# Patient Record
Sex: Female | Born: 1965 | Race: White | Hispanic: No | Marital: Married | Smoking: Never smoker
Health system: Southern US, Community
[De-identification: ages and names within clinical notes are randomized; demographics above are authoritative.]

## PROBLEM LIST (undated history)

## (undated) DIAGNOSIS — Z8669 Personal history of other diseases of the nervous system and sense organs: Secondary | ICD-10-CM

## (undated) DIAGNOSIS — E785 Hyperlipidemia, unspecified: Secondary | ICD-10-CM

## (undated) DIAGNOSIS — T7840XA Allergy, unspecified, initial encounter: Secondary | ICD-10-CM

## (undated) DIAGNOSIS — R55 Syncope and collapse: Secondary | ICD-10-CM

## (undated) HISTORY — DX: Syncope and collapse: R55

## (undated) HISTORY — DX: Allergy, unspecified, initial encounter: T78.40XA

## (undated) HISTORY — DX: Hyperlipidemia, unspecified: E78.5

## (undated) HISTORY — DX: Personal history of other diseases of the nervous system and sense organs: Z86.69

---

## 2000-10-16 ENCOUNTER — Emergency Department (HOSPITAL_COMMUNITY): Admission: EM | Admit: 2000-10-16 | Discharge: 2000-10-16 | Payer: Self-pay | Admitting: Emergency Medicine

## 2000-12-17 ENCOUNTER — Other Ambulatory Visit: Admission: RE | Admit: 2000-12-17 | Discharge: 2000-12-17 | Payer: Self-pay | Admitting: Gynecology

## 2001-12-31 ENCOUNTER — Other Ambulatory Visit: Admission: RE | Admit: 2001-12-31 | Discharge: 2001-12-31 | Payer: Self-pay | Admitting: Gynecology

## 2003-01-05 ENCOUNTER — Other Ambulatory Visit: Admission: RE | Admit: 2003-01-05 | Discharge: 2003-01-05 | Payer: Self-pay | Admitting: Gynecology

## 2003-03-26 ENCOUNTER — Encounter: Payer: Self-pay | Admitting: Gynecology

## 2003-03-26 ENCOUNTER — Encounter: Admission: RE | Admit: 2003-03-26 | Discharge: 2003-03-26 | Payer: Self-pay | Admitting: Gynecology

## 2004-01-12 ENCOUNTER — Other Ambulatory Visit: Admission: RE | Admit: 2004-01-12 | Discharge: 2004-01-12 | Payer: Self-pay | Admitting: Gynecology

## 2005-01-23 ENCOUNTER — Other Ambulatory Visit: Admission: RE | Admit: 2005-01-23 | Discharge: 2005-01-23 | Payer: Self-pay | Admitting: Gynecology

## 2005-08-08 ENCOUNTER — Other Ambulatory Visit: Admission: RE | Admit: 2005-08-08 | Discharge: 2005-08-08 | Payer: Self-pay | Admitting: Gynecology

## 2006-02-19 ENCOUNTER — Other Ambulatory Visit: Admission: RE | Admit: 2006-02-19 | Discharge: 2006-02-19 | Payer: Self-pay | Admitting: Gynecology

## 2006-05-28 ENCOUNTER — Encounter: Admission: RE | Admit: 2006-05-28 | Discharge: 2006-05-28 | Payer: Self-pay | Admitting: Family Medicine

## 2006-08-06 ENCOUNTER — Other Ambulatory Visit: Admission: RE | Admit: 2006-08-06 | Discharge: 2006-08-06 | Payer: Self-pay | Admitting: Gynecology

## 2006-12-13 ENCOUNTER — Ambulatory Visit (HOSPITAL_COMMUNITY): Admission: RE | Admit: 2006-12-13 | Discharge: 2006-12-13 | Payer: Self-pay | Admitting: Cardiovascular Disease

## 2006-12-18 ENCOUNTER — Ambulatory Visit (HOSPITAL_COMMUNITY): Admission: RE | Admit: 2006-12-18 | Discharge: 2006-12-18 | Payer: Self-pay | Admitting: *Deleted

## 2006-12-18 HISTORY — PX: TILT TABLE STUDY: SHX6124

## 2007-03-19 ENCOUNTER — Other Ambulatory Visit: Admission: RE | Admit: 2007-03-19 | Discharge: 2007-03-19 | Payer: Self-pay | Admitting: Gynecology

## 2008-04-15 ENCOUNTER — Encounter: Admission: RE | Admit: 2008-04-15 | Discharge: 2008-04-15 | Payer: Self-pay | Admitting: Gynecology

## 2009-09-09 ENCOUNTER — Ambulatory Visit (HOSPITAL_COMMUNITY): Admission: RE | Admit: 2009-09-09 | Discharge: 2009-09-09 | Payer: Self-pay | Admitting: Cardiovascular Disease

## 2010-04-27 HISTORY — PX: US ECHOCARDIOGRAPHY: HXRAD669

## 2010-05-17 ENCOUNTER — Encounter: Admission: RE | Admit: 2010-05-17 | Discharge: 2010-05-17 | Payer: Self-pay | Admitting: Gynecology

## 2010-10-23 HISTORY — PX: ABDOMINAL HYSTERECTOMY: SHX81

## 2011-03-05 ENCOUNTER — Emergency Department (HOSPITAL_COMMUNITY): Payer: BC Managed Care – PPO

## 2011-03-05 ENCOUNTER — Emergency Department (HOSPITAL_COMMUNITY)
Admission: EM | Admit: 2011-03-05 | Discharge: 2011-03-05 | Disposition: A | Discharge status: Home / Self Care | Payer: BC Managed Care – PPO | Attending: Emergency Medicine | Admitting: Emergency Medicine

## 2011-03-05 DIAGNOSIS — M545 Low back pain, unspecified: Secondary | ICD-10-CM | POA: Insufficient documentation

## 2011-03-05 DIAGNOSIS — W1789XA Other fall from one level to another, initial encounter: Secondary | ICD-10-CM | POA: Insufficient documentation

## 2011-03-05 DIAGNOSIS — Z79899 Other long term (current) drug therapy: Secondary | ICD-10-CM | POA: Insufficient documentation

## 2011-03-05 DIAGNOSIS — M25559 Pain in unspecified hip: Secondary | ICD-10-CM | POA: Insufficient documentation

## 2011-03-05 DIAGNOSIS — R51 Headache: Secondary | ICD-10-CM | POA: Insufficient documentation

## 2011-03-05 DIAGNOSIS — I10 Essential (primary) hypertension: Secondary | ICD-10-CM | POA: Insufficient documentation

## 2011-03-05 DIAGNOSIS — S335XXA Sprain of ligaments of lumbar spine, initial encounter: Secondary | ICD-10-CM | POA: Insufficient documentation

## 2011-03-05 DIAGNOSIS — M542 Cervicalgia: Secondary | ICD-10-CM | POA: Insufficient documentation

## 2011-03-05 DIAGNOSIS — N949 Unspecified condition associated with female genital organs and menstrual cycle: Secondary | ICD-10-CM | POA: Insufficient documentation

## 2011-03-10 NOTE — Cardiovascular Report (Signed)
NAMEARTEMIS, Elizabeth Romero                ACCOUNT NO.:  [id number]   MEDICAL RECORD NO.:  [id number]          PATIENT TYPE:  OIB   LOCATION:  2899                         FACILITY:  [facility]   PHYSICIAN:  Darlin Priestly, MD  DATE OF BIRTH:  04/09/1966   DATE OF PROCEDURE:  DATE OF DISCHARGE:                            CARDIAC CATHETERIZATION   PROCEDURES:  Heads-up tilt-table testing.   COMPLICATIONS:  None.   INDICATIONS:  [name] is a 44 year old female patient of Dr. Franchot Heidelberg and Dr. Vernon Prey with a history of intermittent tachy  palpitations and presyncope.  This has been going on for the last year  though she has noted that recently that she has had increasing in these  symptoms which occur at random including while sitting down.  She  initially feels like she gets a tachycardic and becomes very  lightheaded.  She is in the process of wearing an event monitor.  She is  now refereed for tilt table testing to rule out possible  neurocardiogenic etiology.   DESCRIPTION OF OPERATION:  After obtaining informed consent, the patient  cardiac cath lab in a fasting state and placed in a supine position.  Hemodynamic measures were obtained.  Resting heart rate is 80 with a  resting blood pressure os 110/76.  She was monitored for approximately 5  minutes and then tilt to 70 degree heads-up position.  Approximately 10  minutes into it she was noted to have progressive increasing heart rate.  Heart rates up to 112.  At approximately 15 minutes she was noted to  become markedly tachycardic with what appears to be a sinus tachycardia  rates  up to 162.  Blood pressure remained in the 100-110 range  systolically.  She then began to complain of shortness of breath and  lightheaded and then she had a marked drop in her heart rate from 140-  160 down to 45.  At that point her blood pressure dropped to 68/46 and  she became nonresponsive.  She was immediately returned to the supine  position where hemodynamic recovery was rather prompt with a return of  her blood pressure of 90/60 and return of her heart rate to 72.  She was  monitored for approximately 5-10 additional minutes and remained  hemodynamically stable and awake and alert without complaints.  She was  then transferred to the recovery room in stable condition.   CONCLUSIONS:  Positive heads-up tilt-table testing with documented sinus  tachycardia rates up to 162 and then abrupt drop to 45 with drop in  blood pressure to 60 and brief episode of unconsciousness.      Darlin Priestly, MD  Electronically Signed     RHM/MEDQ  D:  12/18/2006  T:  12/18/2006  Job:  [id number]   cc:   Gerlene Burdock A. Alanda Amass, M.D.  Ernestina Penna, M.D.

## 2012-04-10 HISTORY — PX: NM MYOCAR PERF WALL MOTION: HXRAD629

## 2013-01-10 ENCOUNTER — Ambulatory Visit: Payer: BC Managed Care – PPO

## 2013-01-10 ENCOUNTER — Encounter: Payer: Self-pay | Admitting: Family Medicine

## 2013-01-10 ENCOUNTER — Ambulatory Visit (INDEPENDENT_AMBULATORY_CARE_PROVIDER_SITE_OTHER): Payer: BC Managed Care – PPO | Admitting: Family Medicine

## 2013-01-10 VITALS — BP 103/71 | HR 78 | Temp 97.5°F | Wt 133.6 lb

## 2013-01-10 DIAGNOSIS — R05 Cough: Secondary | ICD-10-CM

## 2013-01-10 DIAGNOSIS — R059 Cough, unspecified: Secondary | ICD-10-CM

## 2013-01-10 DIAGNOSIS — J209 Acute bronchitis, unspecified: Secondary | ICD-10-CM

## 2013-01-10 DIAGNOSIS — J00 Acute nasopharyngitis [common cold]: Secondary | ICD-10-CM

## 2013-01-10 MED ORDER — HYDROCODONE-HOMATROPINE 5-1.5 MG/5ML PO SYRP: 5.0000 mL | ORAL_SOLUTION | Freq: Three times a day (TID) | ORAL | Status: DC | PRN

## 2013-01-10 MED ORDER — FLUTICASONE PROPIONATE 50 MCG/ACT NA SUSP: 2.0000 | Freq: Every day | NASAL | Status: DC

## 2013-01-10 NOTE — Progress Notes (Signed)
Subjective:     Patient ID: Elizabeth Romero, female   DOB: 05-24-1966, 47 y.o.   MRN: [id number]  HPI Patient was seen 4 days ago with acute bronchitis she then called back with severe coughing and had proair inhaler added. She is improving with the cough but still feels bad congested chest and productive of phlegm and some wheeziness for the cough. She's not having any night sweats and no fever. Never had asthma before.  No chest pain no palpitations no pedal edema her head is congested as well and the right ear feels full of fluid. Past Medical History  Diagnosis Date  . Allergy    Past Surgical History  Procedure Laterality Date  . Abdominal hysterectomy  2012    partial   History   Social History  . Marital Status: Married    Spouse Name: N/A    Number of Children: N/A  . Years of Education: N/A   Occupational History  . Not on file.   Social History Main Topics  . Smoking status: Never Smoker   . Smokeless tobacco: Not on file  . Alcohol Use: Not on file  . Drug Use: Not on file  . Sexually Active: Not on file   Other Topics Concern  . Not on file   Social History Narrative  . No narrative on file   Family History  Problem Relation Age of Onset  . Hypertension Father   . Hypertension Sister    No current outpatient prescriptions on file prior to visit.   No current facility-administered medications on file prior to visit.   No Known Allergies  There is no immunization history on file for this patient. Prior to Admission medications   Medication Sig Start Date End Date Taking? Authorizing Provider  cefdinir (OMNICEF) 300 MG capsule  01/03/13  Yes Historical Provider, MD  CHERATUSSIN AC 100-10 MG/5ML syrup  01/03/13  Yes Historical Provider, MD  metoprolol succinate (TOPROL-XL) 25 MG 24 hr tablet  12/08/12  Yes Historical Provider, MD  pantoprazole (PROTONIX) 40 MG tablet  10/28/12  Yes Historical Provider, MD  fluticasone (FLONASE) 50 MCG/ACT nasal spray Place 2  sprays into the nose daily. 01/10/13   Ileana Ladd, MD  HYDROcodone-homatropine [facility]) 5-1.5 MG/5ML syrup Take 5 mLs by mouth every 8 (eight) hours as needed for cough. 01/10/13   Ileana Ladd, MD    Review of Systems  Constitutional: Positive for fatigue.  HENT: Positive for congestion.   Eyes: Negative.   Respiratory: Positive for cough and wheezing.   Cardiovascular: Negative.   Gastrointestinal: Negative.   Endocrine: Negative.   Genitourinary: Negative.   Musculoskeletal: Negative.   Allergic/Immunologic: Negative.   Neurological: Positive for weakness.  Hematological: Negative.   Psychiatric/Behavioral: Negative.        Objective:   Physical Exam    On examination she appeared in good spirits. However she is congested with a barking cough and a slight wheeze at the end of the cough Well developed, well nourished. Vital signs as documented. BP 103/71  Pulse 78  Temp(Src) 97.5 F (36.4 C) (Oral)  Wt 133 lb 9.6 oz (60.601 kg)  LMP 12/28/2012  Skin warm and dry and without overt rashes. Head & Neck without JVD. Lungs scattered rhonchi a few end expiratory wheezes. No rales..  Heart exam notable for regular rhythm, normal sounds and absence of murmurs, rubs or gallops. Abdomen unremarkable and without evidence of organomegaly, masses, or abdominal aortic enlargement.  Breast exam:  not performed. Gyn Exam: Not performed. External Genitalia: Vagina:: Cervix: Uterus: Adnexae: R/V: Extremities nonedematous. Assessment:     Cough - Plan: DG Chest 2 View  Acute bronchitis  Acute rhinitis - Plan: fluticasone (FLONASE) 50 MCG/ACT nasal spray      Plan:     She did not get a prescription for proair HFA as yet. We have samples we have determined demonstrated use of inhaler. She should continue with the [facility]. She should continue with the protonix.   I added Flonase today and moved up to a stronger cough medicine. Orders Placed This Encounter  Procedures   . DG Chest 2 View    Standing Status: Future     Number of Occurrences: 1     Standing Expiration Date: 03/12/2014    Order Specific Question:  Reason for Exam (SYMPTOM  OR DIAGNOSIS REQUIRED)    Answer:  cough    Order Specific Question:  Is the patient pregnant?    Answer:  No    Order Specific Question:  Preferred imaging location?    Answer:  Internal   [facility] reading (PRIMARY) by  Dr. Leodis Sias: Preliminary chest x-ray shows peribronchial edema, reflective of a bronchitic event. Await official reading of the x-ray.                                  Kaedon Fanelli P. Modesto Charon, M.D.

## 2013-07-28 ENCOUNTER — Telehealth: Payer: Self-pay | Admitting: Cardiovascular Disease

## 2013-07-28 NOTE — Telephone Encounter (Signed)
Need refill on Metoprolol 25 mg #90

## 2013-08-05 ENCOUNTER — Other Ambulatory Visit: Payer: Self-pay | Admitting: *Deleted

## 2013-08-05 MED ORDER — METOPROLOL SUCCINATE ER 25 MG PO TB24: 25.0000 mg | ORAL_TABLET | Freq: Every day | ORAL | Status: DC

## 2013-08-05 NOTE — Telephone Encounter (Signed)
Metoprolol refilled.

## 2013-08-05 NOTE — Telephone Encounter (Signed)
Message forwarded to [facility]. Berlinda Last, LPN. Paper chart requested. [phone])

## 2013-08-11 ENCOUNTER — Other Ambulatory Visit: Payer: Self-pay | Admitting: *Deleted

## 2013-08-11 MED ORDER — METOPROLOL SUCCINATE ER 25 MG PO TB24: 25.0000 mg | ORAL_TABLET | Freq: Every day | ORAL | Status: DC

## 2013-08-13 ENCOUNTER — Encounter: Payer: Self-pay | Admitting: Cardiovascular Disease

## 2013-08-13 ENCOUNTER — Ambulatory Visit (INDEPENDENT_AMBULATORY_CARE_PROVIDER_SITE_OTHER): Payer: BC Managed Care – PPO | Admitting: Cardiovascular Disease

## 2013-08-13 VITALS — BP 110/60 | HR 79 | Resp 16 | Ht 69.0 in | Wt 178.9 lb

## 2013-08-13 DIAGNOSIS — R5383 Other fatigue: Secondary | ICD-10-CM

## 2013-08-13 DIAGNOSIS — R5381 Other malaise: Secondary | ICD-10-CM

## 2013-08-13 DIAGNOSIS — E785 Hyperlipidemia, unspecified: Secondary | ICD-10-CM

## 2013-08-13 DIAGNOSIS — E782 Mixed hyperlipidemia: Secondary | ICD-10-CM

## 2013-08-13 DIAGNOSIS — R55 Syncope and collapse: Secondary | ICD-10-CM

## 2013-08-13 DIAGNOSIS — Z79899 Other long term (current) drug therapy: Secondary | ICD-10-CM

## 2013-08-13 NOTE — Patient Instructions (Signed)
Your physician recommends that you schedule a follow-up appointment in: one year  Your physician recommends that you have FASTING lab work.

## 2013-08-24 ENCOUNTER — Encounter: Payer: Self-pay | Admitting: Cardiovascular Disease

## 2013-08-24 DIAGNOSIS — R55 Syncope and collapse: Secondary | ICD-10-CM | POA: Insufficient documentation

## 2013-08-24 DIAGNOSIS — E785 Hyperlipidemia, unspecified: Secondary | ICD-10-CM | POA: Insufficient documentation

## 2013-08-24 NOTE — Assessment & Plan Note (Signed)
Her most recent lipid profile was performed in January of this year showed a total cholesterol 127, triglycerides 79, HDL 48, LDL 63 all of which are excellent. Her thyroid function tests, liver function tests, hemoglobin, electrolytes were all normal. Creatinine was 0.64

## 2013-08-24 NOTE — Assessment & Plan Note (Signed)
It has been a long time since she has had a syncopal event. She is managing this well by avoiding known triggers. She has benefited from low-dose beta blockers. No changes are made for medications. She is reminded to stay well hydrated and to eat a relatively salt rich diet. Her blood pressure is excellent. No changes are made to her medications.

## 2013-08-24 NOTE — Progress Notes (Signed)
Patient ID: Elizabeth Romero, female   DOB: 01-03-1966, 47 y.o.   MRN: [id number]      Reason for office visit Establish new cardiology followup  I previously met Elizabeth Romero when I performed a heart catheterization on her father. 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  She feels well and has no cardiac complaints. In 2008 she had a positive tilt table test combined cardioinhibitory and vasodilator components. She did not tolerate SSRI therapy well but has been doing well on low dose of metoprolol. An echocardiogram from 2011 was normal as was a nuclear stress test in June of 2013 (the latter showed some changes consistent with attenuation artifact). a 30 day event monitor in 2008 was normal.   No Known Allergies  Current Outpatient Prescriptions  Medication Sig Dispense Refill  . fluticasone (FLONASE) 50 MCG/ACT nasal spray Place 2 sprays into the nose daily as needed.      . metoprolol succinate (TOPROL-XL) 25 MG 24 hr tablet Take 1 tablet (25 mg total) by mouth daily. Keep appointment for refills.  30 tablet  0  . pantoprazole (PROTONIX) 40 MG tablet Take 40 mg by mouth daily as needed.       . simvastatin (ZOCOR) 40 MG tablet Take 40 mg by mouth every evening.       No current facility-administered medications for this visit.    Past Medical History  Diagnosis Date  . Allergy     Past Surgical History  Procedure Laterality Date  . Abdominal hysterectomy  2012    partial    Family History  Problem Relation Age of Onset  . Hypertension Father   . Hypertension Sister     History   Social History  . Marital Status: Married    Spouse Name: N/A    Number of Children: N/A  . Years of Education: N/A   Occupational History  . Not on file.   Social History Main Topics  . Smoking status: Never Smoker   . Smokeless tobacco: Not on file  . Alcohol Use: Not on file  . Drug Use: Not on file  . Sexual Activity: Not on file    Other Topics Concern  . Not on file   Social History Narrative  . No narrative on file    Review of systems: The patient specifically denies any chest pain at rest or with exertion, dyspnea at rest or with exertion, orthopnea, paroxysmal nocturnal dyspnea, syncope, palpitations, focal neurological deficits, intermittent claudication, lower extremity edema, unexplained weight gain, cough, hemoptysis or wheezing.  The patient also denies abdominal pain, nausea, vomiting, dysphagia, diarrhea, constipation, polyuria, polydipsia, dysuria, hematuria, frequency, urgency, abnormal bleeding or bruising, fever, chills, unexpected weight changes, mood swings, change in skin or hair texture, change in voice quality, auditory or visual problems, allergic reactions or rashes, new musculoskeletal complaints other than usual "aches and pains".   PHYSICAL EXAM BP 110/60  Pulse 79  Resp 16  Ht 5\' 9"  (1.753 m)  Wt 178 lb 14.4 oz (81.149 kg)  BMI 26.41 kg/m2  General: Alert, oriented x3, no distress Head: no evidence of trauma, PERRL, EOMI, no exophtalmos or lid lag, no myxedema, no xanthelasma; normal ears, nose and oropharynx Neck: normal jugular venous pulsations and no hepatojugular reflux; brisk carotid pulses without delay and no carotid bruits Chest: clear to auscultation, no signs of consolidation by percussion or palpation, normal fremitus, symmetrical and full respiratory excursions Cardiovascular: normal position and  quality of the apical impulse, regular rhythm, normal first and second heart sounds, no murmurs, rubs or gallops Abdomen: no tenderness or distention, no masses by palpation, no abnormal pulsatility or arterial bruits, normal bowel sounds, no hepatosplenomegaly Extremities: no clubbing, cyanosis or edema; 2+ radial, ulnar and brachial pulses bilaterally; 2+ right femoral, posterior tibial and dorsalis pedis pulses; 2+ left femoral, posterior tibial and dorsalis pedis pulses; no  subclavian or femoral bruits Neurological: grossly nonfocal   EKG: Sinus rhythm, very minor nonspecific ST segment abnormality  Lipid Panel  January 2014 total cholesterol 127, triglycerides 79, HDL 48, LDL 63  thyroid function tests, liver function tests, hemoglobin, electrolytes were all normal. Creatinine was 0.64  ASSESSMENT AND PLAN Neurocardiogenic syncope It has been a long time since she has had a syncopal event. She is managing this well by avoiding known triggers. She has benefited from low-dose beta blockers. No changes are made for medications. She is reminded to stay well hydrated and to eat a relatively salt rich diet. Her blood pressure is excellent. No changes are made to her medications.  Hyperlipidemia Her most recent lipid profile was performed in January of this year showed a total cholesterol 127, triglycerides 79, HDL 48, LDL 63 all of which are excellent. Her thyroid function tests, liver function tests, hemoglobin, electrolytes were all normal. Creatinine was 0.64   Orders Placed This Encounter  Procedures  . CBC  . Lipid panel  . Comprehensive metabolic panel  . EKG 12-Lead   Meds ordered this encounter  Medications  . simvastatin (ZOCOR) 40 MG tablet    Sig: Take 40 mg by mouth every evening.  . fluticasone (FLONASE) 50 MCG/ACT nasal spray    Sig: Place 2 sprays into the nose daily as needed.    Junious Silk, MD, [facility] [phone] office [phone] pager

## 2013-08-28 ENCOUNTER — Other Ambulatory Visit: Payer: Self-pay

## 2013-09-11 ENCOUNTER — Other Ambulatory Visit: Payer: Self-pay | Admitting: Cardiovascular Disease

## 2013-09-11 LAB — CBC
HCT: 41.7 % (ref 36.0–46.0)
Hemoglobin: 14.1 g/dL (ref 12.0–15.0)
MCH: 31.1 pg (ref 26.0–34.0)
MCHC: 33.8 g/dL (ref 30.0–36.0)
MCV: 91.9 fL (ref 78.0–100.0)
Platelets: 151 10*3/uL (ref 150–400)
RBC: 4.54 MIL/uL (ref 3.87–5.11)
RDW: 13.4 % (ref 11.5–15.5)
WBC: 6.9 10*3/uL (ref 4.0–10.5)

## 2013-09-11 LAB — COMPREHENSIVE METABOLIC PANEL
ALT: 10 U/L (ref 0–35)
AST: 13 U/L (ref 0–37)
Albumin: 4.2 g/dL (ref 3.5–5.2)
Alkaline Phosphatase: 55 U/L (ref 39–117)
BUN: 10 mg/dL (ref 6–23)
CO2: 28 mEq/L (ref 19–32)
Calcium: 8.9 mg/dL (ref 8.4–10.5)
Chloride: 107 mEq/L (ref 96–112)
Creat: 0.6 mg/dL (ref 0.50–1.10)
Glucose, Bld: 77 mg/dL (ref 70–99)
Potassium: 4 mEq/L (ref 3.5–5.3)
Sodium: 140 mEq/L (ref 135–145)
Total Bilirubin: 0.6 mg/dL (ref 0.3–1.2)
Total Protein: 6.8 g/dL (ref 6.0–8.3)

## 2013-09-11 LAB — LIPID PANEL
Cholesterol: 195 mg/dL (ref 0–200)
HDL: 46 mg/dL (ref >39)
LDL Cholesterol: 133 mg/dL — ABNORMAL HIGH (ref 0–99)
Total CHOL/HDL Ratio: 4.2 Ratio
Triglycerides: 82 mg/dL (ref <150)
VLDL: 16 mg/dL (ref 0–40)

## 2013-09-11 NOTE — Telephone Encounter (Signed)
Rx was sent to pharmacy electronically. 

## 2013-10-03 ENCOUNTER — Other Ambulatory Visit: Payer: Self-pay | Admitting: *Deleted

## 2013-10-03 MED ORDER — METOPROLOL SUCCINATE ER 25 MG PO TB24: 25.0000 mg | ORAL_TABLET | Freq: Every day | ORAL | Status: DC

## 2013-10-03 NOTE — Telephone Encounter (Signed)
E-sent refill #90 x 3

## 2014-06-15 ENCOUNTER — Ambulatory Visit (INDEPENDENT_AMBULATORY_CARE_PROVIDER_SITE_OTHER): Payer: BC Managed Care – PPO | Admitting: Family Medicine

## 2014-06-15 ENCOUNTER — Encounter: Payer: Self-pay | Admitting: Family Medicine

## 2014-06-15 ENCOUNTER — Ambulatory Visit (INDEPENDENT_AMBULATORY_CARE_PROVIDER_SITE_OTHER): Payer: BC Managed Care – PPO

## 2014-06-15 VITALS — BP 104/65 | HR 77 | Temp 98.1°F | Ht 69.0 in | Wt 177.4 lb

## 2014-06-15 DIAGNOSIS — M79601 Pain in right arm: Secondary | ICD-10-CM

## 2014-06-15 DIAGNOSIS — M79609 Pain in unspecified limb: Secondary | ICD-10-CM

## 2014-06-15 NOTE — Progress Notes (Signed)
   Subjective:    Patient ID: Elizabeth Romero, female    DOB: 1966/03/09, 48 y.o.   MRN: [id number]  HPI Patient was walking and tripped and fell and broke fall with right hand then rolled onto the right arm and felt something pop and then has had persistent pain and discomfort with abduction.   Review of Systems C/o arthralgias and myalgias   No chest pain, SOB, HA, dizziness, vision change, N/V, diarrhea, constipation, dysuria, urinary urgency or frequency or rash.  Objective:   Physical Exam  Vital signs noted  Well developed well nourished female.  HEENT - Head atraumatic Normocephalic Respiratory - Lungs CTA bilateral Cardiac - RRR S1 and S2 without murmur MS - TTP right deltoid insertion site and decreased ROM with abduction     Xray right humerus - no fracture Deatra Canter FNP Assessment & Plan:  Right arm pain - Plan: DG Humerus Right.  Tylenol and motrin otc prn  Deatra Canter FNP

## 2014-10-02 IMAGING — CR DG HUMERUS 2V *R*
2 series · 2 of 2 positions shown · non-contrast
Comparison: None.

CLINICAL DATA: Pain post fall.

EXAM:
RIGHT HUMERUS - 2+ VIEW

[view not recorded (1 of 2)]
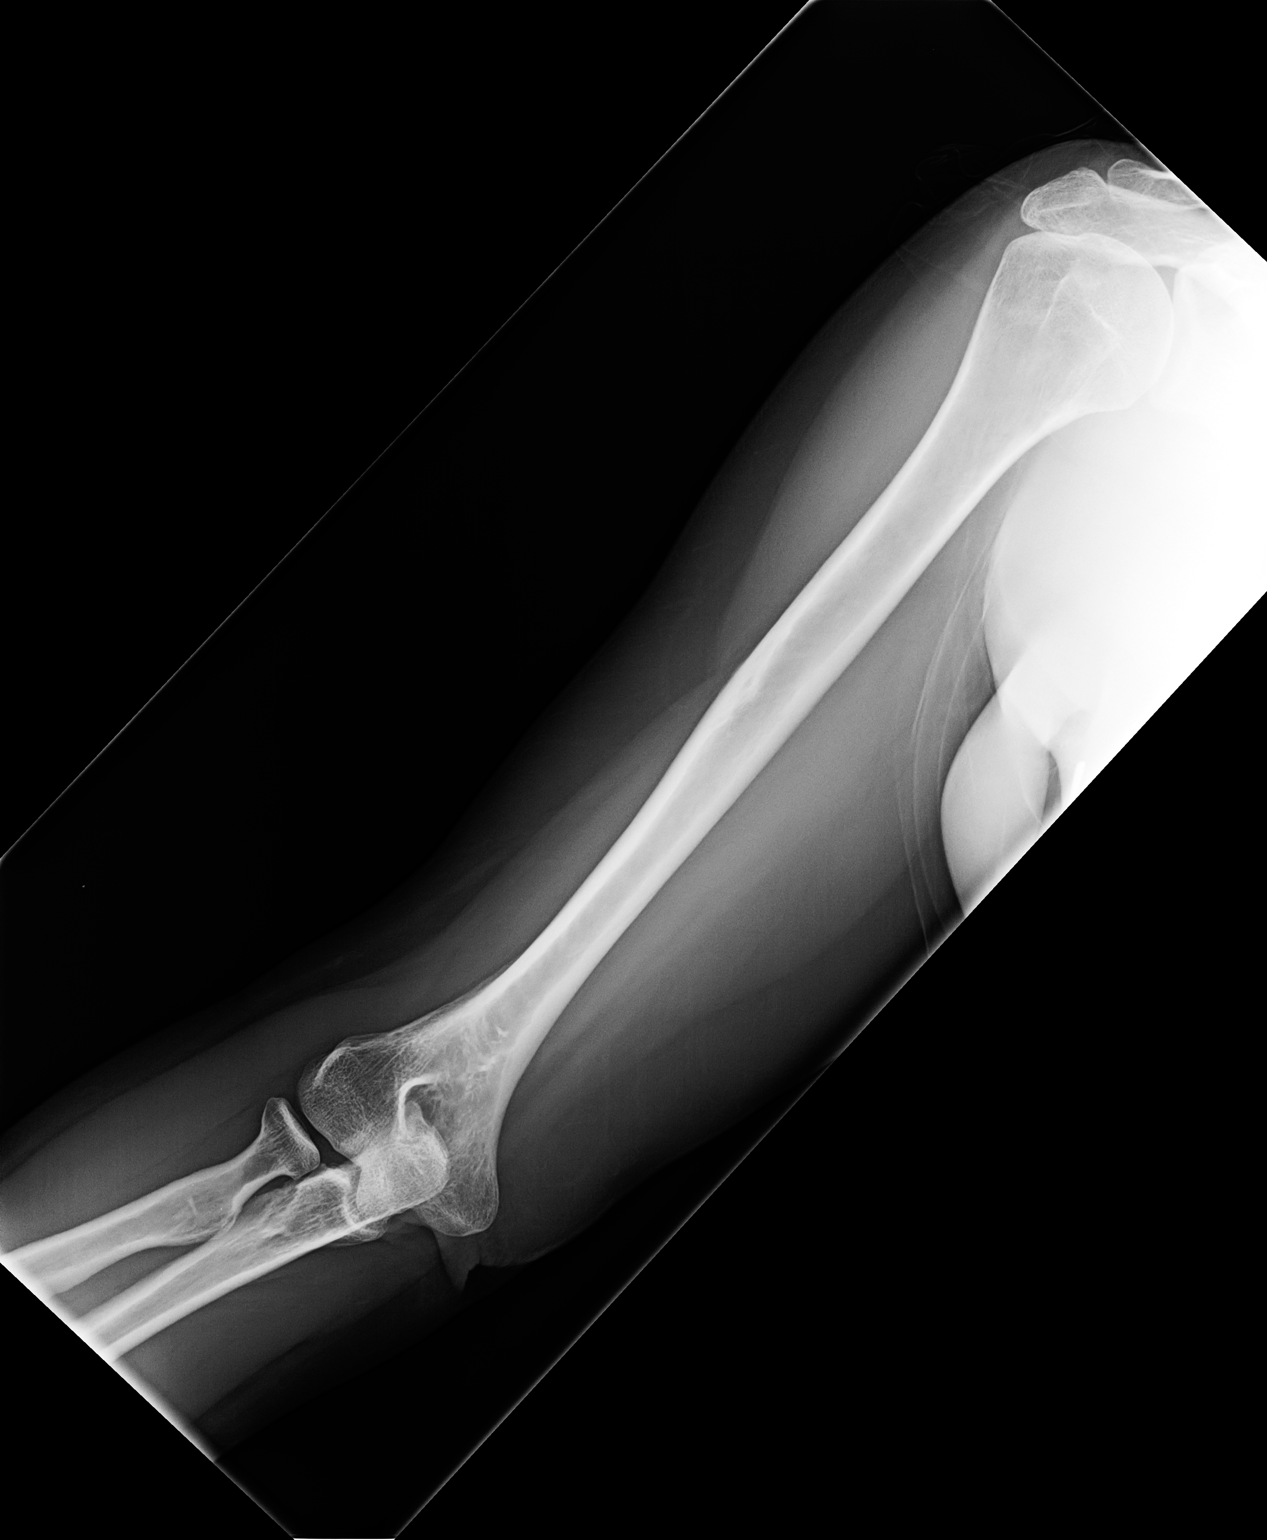

[view not recorded (2 of 2)]
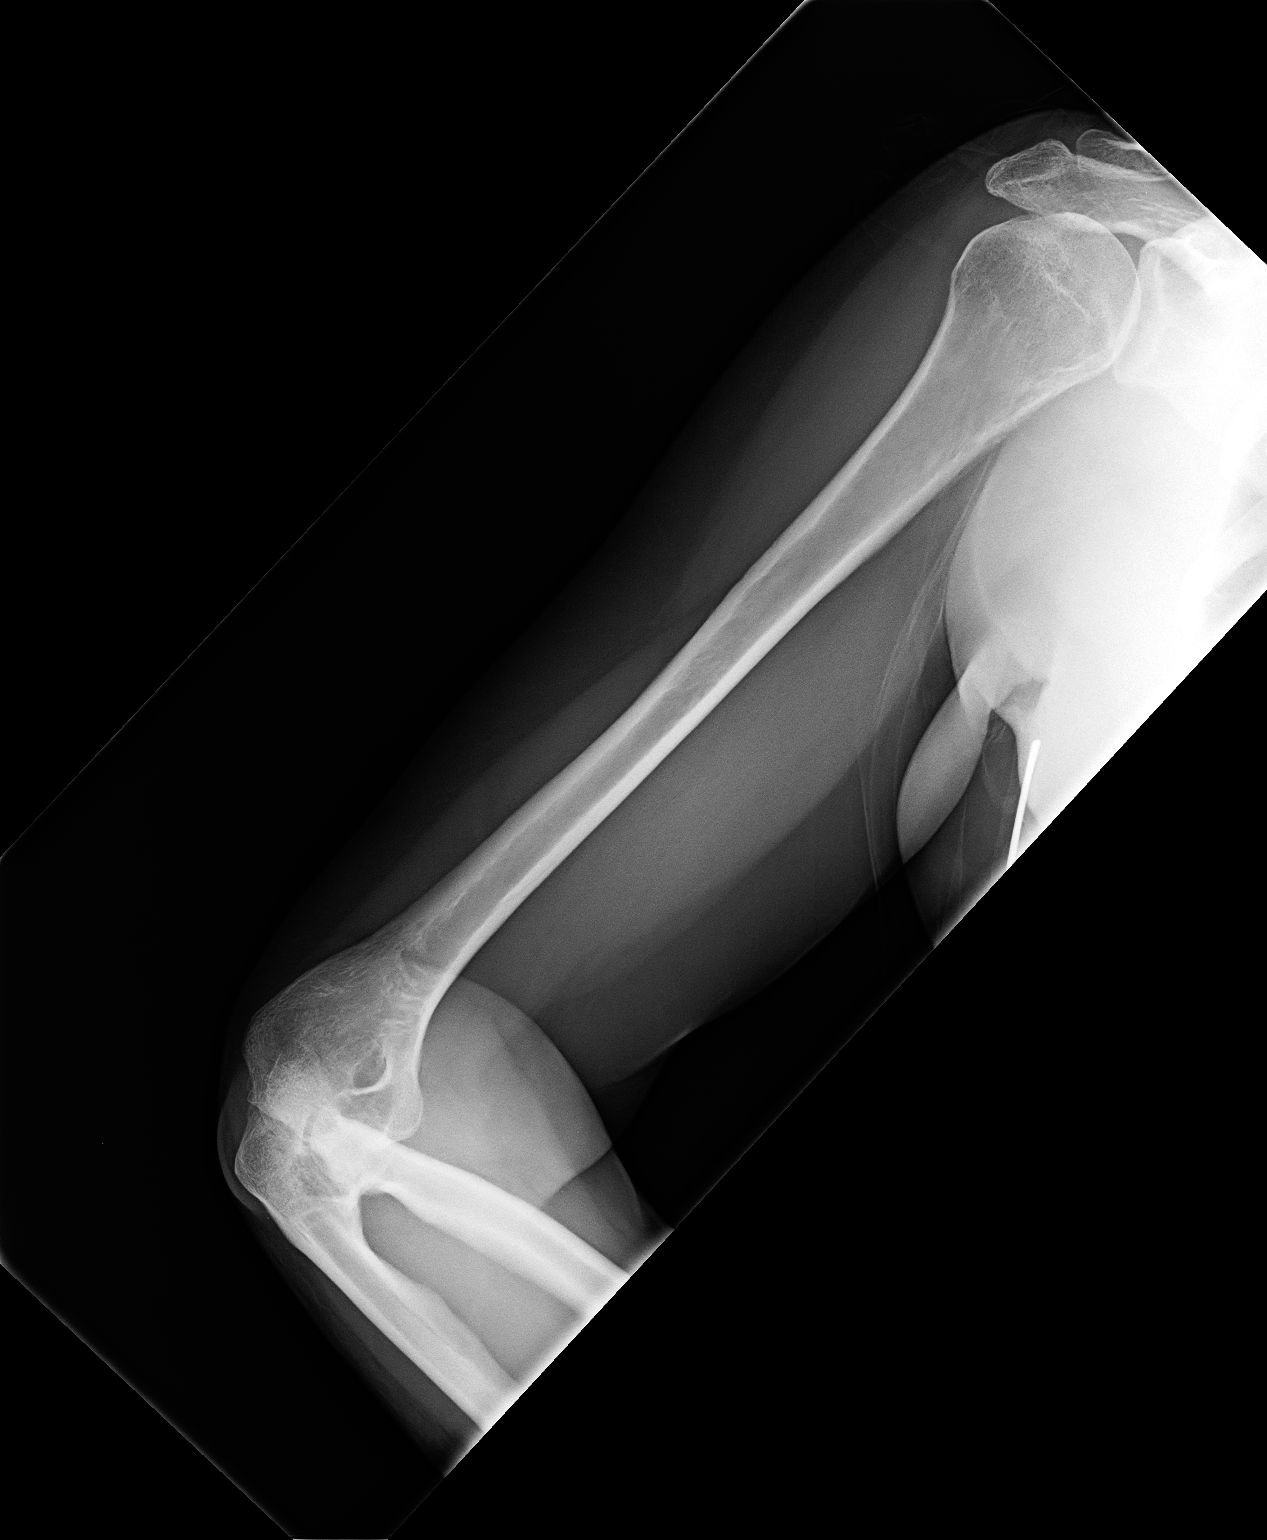

[2 of 2 positions shown; findings below may reference images not displayed]

FINDINGS: There is no evidence of fracture or other focal bone lesions. Soft
tissues are unremarkable.
IMPRESSION: Negative.

## 2014-10-21 ENCOUNTER — Encounter: Payer: Self-pay | Admitting: *Deleted

## 2014-10-26 ENCOUNTER — Encounter: Payer: Self-pay | Admitting: Cardiovascular Disease

## 2014-10-26 ENCOUNTER — Ambulatory Visit (INDEPENDENT_AMBULATORY_CARE_PROVIDER_SITE_OTHER): Payer: BLUE CROSS/BLUE SHIELD | Admitting: Cardiovascular Disease

## 2014-10-26 VITALS — BP 104/70 | HR 67 | Resp 16 | Ht 69.0 in | Wt 175.0 lb

## 2014-10-26 DIAGNOSIS — E785 Hyperlipidemia, unspecified: Secondary | ICD-10-CM

## 2014-10-26 DIAGNOSIS — R55 Syncope and collapse: Secondary | ICD-10-CM

## 2014-10-26 MED ORDER — PANTOPRAZOLE SODIUM 40 MG PO TBEC: 40.0000 mg | DELAYED_RELEASE_TABLET | Freq: Every day | ORAL | Status: DC | PRN

## 2014-10-26 MED ORDER — METOPROLOL SUCCINATE ER 25 MG PO TB24: 25.0000 mg | ORAL_TABLET | Freq: Every day | ORAL | Status: DC

## 2014-10-26 MED ORDER — SIMVASTATIN 40 MG PO TABS: 40.0000 mg | ORAL_TABLET | Freq: Every evening | ORAL | Status: DC

## 2014-10-26 NOTE — Patient Instructions (Signed)
Dr. Croitoru recommends that you schedule a follow-up appointment in: One year.   

## 2014-10-27 ENCOUNTER — Encounter: Payer: Self-pay | Admitting: Cardiovascular Disease

## 2014-10-27 NOTE — Progress Notes (Signed)
Patient ID: Elizabeth Romero, female   DOB: 1966-05-09, 49 y.o.   MRN: [id number]     Reason for office visit Neurocardiogenic syncope  Elizabeth Romero is here for routine f/u for a lifelong history of very infrequent neurally mediated syncope. No typical events in the last 12 months. She had 3 days of nonstop "indigestion" earlier this year, neither provoked nor worsened by physical activity. She has reare palpitations. Her ECG is normal.  In 2008 she had a positive tilt table test combined cardioinhibitory and vasodilator components. She did not tolerate SSRI therapy well but has been doing well on low dose of metoprolol. An echocardiogram from 2011 was normal as was a nuclear stress test in June of 2013 (the latter showed some changes consistent with attenuation artifact). a 30 day event monitor in 2008 was normal.     No Known Allergies  Current Outpatient Prescriptions  Medication Sig Dispense Refill  . fluticasone (FLONASE) 50 MCG/ACT nasal spray Place 2 sprays into the nose daily as needed.    . metoprolol succinate (TOPROL-XL) 25 MG 24 hr tablet Take 1 tablet (25 mg total) by mouth daily. 90 tablet 3  . pantoprazole (PROTONIX) 40 MG tablet Take 1 tablet (40 mg total) by mouth daily as needed. 90 tablet 3  . simvastatin (ZOCOR) 40 MG tablet Take 1 tablet (40 mg total) by mouth every evening. 90 tablet 3   No current facility-administered medications for this visit.    Past Medical History  Diagnosis Date  . Allergy   . Neurocardiogenic syncope     H/O  . History of migraine headaches   . Hyperlipidemia     Past Surgical History  Procedure Laterality Date  . Abdominal hysterectomy  2012  . Tilt table study  12/18/2006    Positive - documented sinus tach rates up to 162 and dropped abruptly to 45 w/BP drop to 60 w/brief episode of unconsciousness  . US echocardiography  04/27/2010    Normal EF >55%  . Nm myocar perf wall motion  04/10/2012    Low risk scan    Family History  Problem  Relation Age of Onset  . Hypertension Father   . Hypertension Sister     History   Social History  . Marital Status: Married    Spouse Name: N/A    Number of Children: N/A  . Years of Education: N/A   Occupational History  . Not on file.   Social History Main Topics  . Smoking status: Never Smoker   . Smokeless tobacco: Not on file  . Alcohol Use: No  . Drug Use: No  . Sexual Activity: Not on file   Other Topics Concern  . Not on file   Social History Narrative    Review of systems: The patient specifically denies any chest pain at rest or with exertion, dyspnea at rest or with exertion, orthopnea, paroxysmal nocturnal dyspnea, syncope, focal neurological deficits, intermittent claudication, lower extremity edema, unexplained weight gain, cough, hemoptysis or wheezing.  The patient also denies abdominal pain, nausea, vomiting, dysphagia, diarrhea, constipation, polyuria, polydipsia, dysuria, hematuria, frequency, urgency, abnormal bleeding or bruising, fever, chills, unexpected weight changes, mood swings, change in skin or hair texture, change in voice quality, auditory or visual problems, allergic reactions or rashes, new musculoskeletal complaints other than usual "aches and pains". \  PHYSICAL EXAM BP 104/70 mmHg  Pulse 67  Resp 16  Ht  (1.753 m)  Wt 175 lb (79.379 kg)  BMI 25.83  kg/m2  General: Alert, oriented x3, no distress Head: no evidence of trauma, PERRL, EOMI, no exophtalmos or lid lag, no myxedema, no xanthelasma; normal ears, nose and oropharynx Neck: normal jugular venous pulsations and no hepatojugular reflux; brisk carotid pulses without delay and no carotid bruits Chest: clear to auscultation, no signs of consolidation by percussion or palpation, normal fremitus, symmetrical and full respiratory excursions Cardiovascular: normal position and quality of the apical impulse, regular rhythm, normal first and second heart sounds, no murmurs, rubs or  gallops Abdomen: no tenderness or distention, no masses by palpation, no abnormal pulsatility or arterial bruits, normal bowel sounds, no hepatosplenomegaly Extremities: no clubbing, cyanosis or edema; 2+ radial, ulnar and brachial pulses bilaterally; 2+ right femoral, posterior tibial and dorsalis pedis pulses; 2+ left femoral, posterior tibial and dorsalis pedis pulses; no subclavian or femoral bruits Neurological: grossly nonfocal   EKG: NSR/sinus arrhythmia  Lipid Panel     Component Value Date/Time   CHOL 195 09/11/2013 0852   TRIG 82 09/11/2013 0852   HDL 46 09/11/2013 0852   CHOLHDL 4.2 09/11/2013 0852   VLDL 16 09/11/2013 0852   LDLCALC 133* 09/11/2013 0852    BMET    Component Value Date/Time   NA 140 09/11/2013 0852   K 4.0 09/11/2013 0852   CL 107 09/11/2013 0852   CO2 28 09/11/2013 0852   GLUCOSE 77 09/11/2013 0852   BUN 10 09/11/2013 0852   CREATININE 0.60 09/11/2013 0852   CALCIUM 8.9 09/11/2013 0852     ASSESSMENT AND PLAN Neurocardiogenic syncope It has been a long time since she has had a syncopal event. She is managing this well by avoiding known triggers. She has benefited from low-dose beta blockers. No changes are made for medications. She is reminded to stay well hydrated and to eat a relatively salt rich diet. Her blood pressure is excellent. No changes are made to her medications. The 3 day long episode of chest discomfort is unlikely to have had coronary etiology. There is no evidence of a previous event on her ECG. Recommend yearly follow up. Orders Placed This Encounter  Procedures  . EKG 12-Lead   Meds ordered this encounter  Medications  . metoprolol succinate (TOPROL-XL) 25 MG 24 hr tablet    Sig: Take 1 tablet (25 mg total) by mouth daily.    Dispense:  90 tablet    Refill:  3  . pantoprazole (PROTONIX) 40 MG tablet    Sig: Take 1 tablet (40 mg total) by mouth daily as needed.    Dispense:  90 tablet    Refill:  3  . simvastatin (ZOCOR)  40 MG tablet    Sig: Take 1 tablet (40 mg total) by mouth every evening.    Dispense:  90 tablet    Refill:  3    Sebastyan Snodgrass  Thurmon FairMihai Dave Mergen, MD, [facility] [phone] office [phone] pager

## 2014-11-11 ENCOUNTER — Other Ambulatory Visit: Payer: Self-pay | Admitting: Cardiovascular Disease

## 2014-11-11 NOTE — Telephone Encounter (Signed)
Metoprolol succinate refill #90 with 3 refills 10/26/14

## 2015-04-13 ENCOUNTER — Encounter: Payer: Self-pay | Admitting: Family Medicine

## 2015-04-13 ENCOUNTER — Ambulatory Visit (INDEPENDENT_AMBULATORY_CARE_PROVIDER_SITE_OTHER): Payer: BLUE CROSS/BLUE SHIELD | Admitting: Family Medicine

## 2015-04-13 VITALS — BP 93/59 | HR 78 | Temp 97.4°F | Ht 69.0 in | Wt 169.0 lb

## 2015-04-13 DIAGNOSIS — E785 Hyperlipidemia, unspecified: Secondary | ICD-10-CM

## 2015-04-13 DIAGNOSIS — R55 Syncope and collapse: Secondary | ICD-10-CM

## 2015-04-13 DIAGNOSIS — G43909 Migraine, unspecified, not intractable, without status migrainosus: Secondary | ICD-10-CM | POA: Insufficient documentation

## 2015-04-13 DIAGNOSIS — G43B0 Ophthalmoplegic migraine, not intractable: Secondary | ICD-10-CM | POA: Diagnosis not present

## 2015-04-13 NOTE — Progress Notes (Signed)
   Subjective:    Patient ID: Elizabeth Romero, female    DOB: [birth year], 49 y.o.   MRN: [id number]  HPI 49 year old female with history of ophthalmologic migraine. This was diagnosed by her cardiologist who was seeing her for syncopal episode. He also prescribed Imitrex which made her "chest feel funny.". Diagnosis occurred about 5 years ago. Symptoms completely resolved but have returned in the last month and she is having 1-2 episodes per week that last 30-40 minutes. There is no associated headache or other symptoms chest wavy lines. There are no visual field Cuts.   Review of Systems  Constitutional: Negative.   HENT: Negative.   Eyes: Negative.   Respiratory: Negative.   Cardiovascular: Negative.   Gastrointestinal: Negative.   Endocrine: Negative.   Genitourinary: Negative.   Neurological: Negative.        Visual symptoms as described in history of present illnes  Hematological: Negative.   Psychiatric/Behavioral: Negative.    Patient Active Problem List   Diagnosis Date Noted  . Neurocardiogenic syncope 08/24/2013  . Hyperlipidemia 08/24/2013   Outpatient Encounter Prescriptions as of 04/13/2015  Medication Sig  . fluticasone (FLONASE) 50 MCG/ACT nasal spray Place 2 sprays into the nose daily as needed.  . metoprolol succinate (TOPROL-XL) 25 MG 24 hr tablet Take 1 tablet (25 mg total) by mouth daily.  . pantoprazole (PROTONIX) 40 MG tablet Take 1 tablet (40 mg total) by mouth daily as needed.  . simvastatin (ZOCOR) 40 MG tablet Take 1 tablet (40 mg total) by mouth every evening.   No facility-administered encounter medications on file as of 04/13/2015.       Objective:   Physical Exam  Constitutional: She is oriented to person, place, and time. She appears well-developed and well-nourished.  Neurological: She is alert and oriented to person, place, and time. She has normal reflexes. No cranial nerve deficit. Coordination normal.          Assessment & Plan:  1.  Neurocardiogenic syncope Symptoms are controlled, apparently on metoprolol  2. Hyperlipidemia Last lipid level was 2 years ago and patient continues on statin without side effects  3. Ophthalmoplegic migraine, not intractable We discussed headaches at length. I do believe this is best diagnosis for her symptoms. Since they are so short in duration and do not think a triptan would have time to be effective unless it were injected. I have suggested she carry some OTC migraine medicine with her. If headaches continue frequency of to a week or more consider preventive such as Lamictal. Also offered neurology referral for another opinion which she did not seem interested  Frederica Kuster MD

## 2015-10-26 ENCOUNTER — Ambulatory Visit (INDEPENDENT_AMBULATORY_CARE_PROVIDER_SITE_OTHER): Payer: BLUE CROSS/BLUE SHIELD | Admitting: Nurse Practitioner

## 2015-10-26 ENCOUNTER — Encounter: Payer: Self-pay | Admitting: Nurse Practitioner

## 2015-10-26 VITALS — BP 114/72 | HR 79 | Temp 97.7°F | Ht 69.0 in | Wt 169.4 lb

## 2015-10-26 DIAGNOSIS — J069 Acute upper respiratory infection, unspecified: Secondary | ICD-10-CM

## 2015-10-26 MED ORDER — AZITHROMYCIN 250 MG PO TABS: ORAL_TABLET | ORAL | Status: DC

## 2015-10-26 NOTE — Progress Notes (Signed)
  Subjective:     Elizabeth Romero is a 50 y.o. female who presents for evaluation of sinus pain. Symptoms include: congestion, cough, facial pain, headaches, nasal congestion and sinus pressure. Onset of symptoms was 1 week ago. Symptoms have been gradually worsening since that time. Past history is significant for no history of pneumonia or bronchitis. Patient is a non-smoker.  The following portions of the patient's history were reviewed and updated as appropriate: allergies, current medications, past family history, past medical history, past social history, past surgical history and problem list.  Review of Systems Pertinent items are noted in HPI.   Objective:    BP 114/72 mmHg  Pulse 79  Temp(Src) 97.7 F (36.5 C) (Oral)  Ht 5\' 9"  (1.753 m)  Wt 169 lb 6.4 oz (76.839 kg)  BMI 25.00 kg/m2 General appearance: alert and cooperative Eyes: conjunctivae/corneas clear. PERRL, EOM's intact. Fundi benign. Ears: normal TM's and external ear canals both ears Nose: purulent discharge, mild congestion, turbinates red, no sinus tenderness Throat: lips, mucosa, and tongue normal; teeth and gums normal Neck: no adenopathy, no carotid bruit, no JVD, supple, symmetrical, trachea midline and thyroid not enlarged, symmetric, no tenderness/mass/nodules Lungs: clear to auscultation bilaterally and deep wet cough Heart: regular rate and rhythm, S1, S2 normal, no murmur, click, rub or gallop    Assessment:    Acute bacterial URI with cough .    Plan:  1. Take meds as prescribed 2. Use a cool mist humidifier especially during the winter months and when heat has been humid. 3. Use saline nose sprays frequently 4. Saline irrigations of the nose can be very helpful if done frequently.  * 4X daily for 1 week*  * Use of a nettie pot can be helpful with this. Follow directions with this* 5. Drink plenty of fluids 6. Keep thermostat turn down low 7.For any cough or congestion  Use plain Mucinex- regular  strength or max strength is fine   * Children- consult with Pharmacist for dosing 8. For fever or aces or pains- take tylenol or ibuprofen appropriate for age and weight.  * for fevers greater than 101 orally you may alternate ibuprofen and tylenol every  3 hours.    Meds ordered this encounter  Medications  . azithromycin (ZITHROMAX) 250 MG tablet    Sig: Two tablets day one, then one tablet daily next 4 days.    Dispense:  6 tablet    Refill:  0    Order Specific Question:  Supervising Provider    Answer:  Ernestina PennaMOORE, DONALD W [[id number]   Mary-Margaret Daphine DeutscherMartin, FNP

## 2015-10-26 NOTE — Patient Instructions (Signed)

## 2015-10-27 ENCOUNTER — Ambulatory Visit (INDEPENDENT_AMBULATORY_CARE_PROVIDER_SITE_OTHER): Payer: BLUE CROSS/BLUE SHIELD | Admitting: Cardiovascular Disease

## 2015-10-27 ENCOUNTER — Encounter: Payer: Self-pay | Admitting: Cardiovascular Disease

## 2015-10-27 VITALS — BP 108/74 | HR 65 | Ht 69.0 in | Wt 164.4 lb

## 2015-10-27 DIAGNOSIS — E785 Hyperlipidemia, unspecified: Secondary | ICD-10-CM | POA: Diagnosis not present

## 2015-10-27 DIAGNOSIS — R55 Syncope and collapse: Secondary | ICD-10-CM

## 2015-10-27 NOTE — Progress Notes (Signed)
Patient ID: Elizabeth PaymentLisa W Lingo, female   DOB: 09/23/1966, 50 y.o.   MRN: [id number]    Cardiology Office Note    Date:  10/29/2015   ID:  Elizabeth PaymentLisa W Sherrod, DOB 07/05/1966, MRN [id number]  PCP:  Rudi HeapMOORE, DONALD, MD  Cardiologist:    Thurmon FairROITORU,Tonya Carlile, MD   Chief Complaint  Patient presents with  . Annual Exam    no chest pain, no shortness of breath,no edema, no pain in legs, no cramping in legs, no lightheadedness and dizziness    History of Present Illness:  Elizabeth Romero is a 50 y.o. female with infrequent neurally mediated syncope (tilt test in 2008 with combined vasodepressor and cardioinhibitory components, intolerant of SSRI, well controlled on low dose beta blockers). No structural heart disease by echo and nuclear perfusion study and no arrhythmia on 30 day event monitor.  She has done well in the last year: no syncope and no pre-syncope.  She is on a statin for hyperlipidemia and reports that her lipid profile was good even when drawn non-fasting at a workplace health fair. She currently has a mild "cold".  The patient specifically denies any chest pain at rest exertion, dyspnea at rest or with exertion, orthopnea, paroxysmal nocturnal dyspnea, syncope, palpitations, focal neurological deficits, intermittent claudication, lower extremity edema, unexplained weight gain, cough, hemoptysis or wheezing.   Past Medical History  Diagnosis Date  . Allergy   . Neurocardiogenic syncope     H/O  . History of migraine headaches   . Hyperlipidemia     Past Surgical History  Procedure Laterality Date  . Abdominal hysterectomy  2012  . Tilt table study  12/18/2006    Positive - documented sinus tach rates up to 162 and dropped abruptly to 45 w/BP drop to 60 w/brief episode of unconsciousness  . [place] echocardiography  04/27/2010    Normal EF >55%  . Nm myocar perf wall motion  04/10/2012    Low risk scan    Current Outpatient Prescriptions  Medication Sig Dispense Refill  . azithromycin  (ZITHROMAX) 250 MG tablet Two tablets day one, then one tablet daily next 4 days. 6 tablet 0  . fluticasone (FLONASE) 50 MCG/ACT nasal spray Place 2 sprays into the nose daily as needed.    . metoprolol succinate (TOPROL-XL) 25 MG 24 hr tablet Take 1 tablet (25 mg total) by mouth daily. 90 tablet 3  . pantoprazole (PROTONIX) 40 MG tablet Take 1 tablet (40 mg total) by mouth daily as needed. 90 tablet 3  . simvastatin (ZOCOR) 40 MG tablet Take 1 tablet (40 mg total) by mouth every evening. 90 tablet 3   No current facility-administered medications for this visit.    Allergies:   Review of patient's allergies indicates no known allergies.   Social History   Social History  . Marital Status: Married    Spouse Name: N/A  . Number of Children: N/A  . Years of Education: N/A   Social History Main Topics  . Smoking status: Never Smoker   . Smokeless tobacco: None  . Alcohol Use: No  . Drug Use: No  . Sexual Activity: Not Asked   Other Topics Concern  . None   Social History Narrative     Family History:  The patient's family history includes Hypertension in her father and sister.   ROS:   Please see the history of present illness.    Review of Systems  All other systems reviewed and are negative.   PHYSICAL EXAM:  VS:  BP 108/74 mmHg  Pulse 65  Ht 5\' 9"  (1.753 m)  Wt 164 lb 7 oz (74.588 kg)  BMI 24.27 kg/m2  LMP 10/18/2015   GEN: Well nourished, well developed, in no acute distress HEENT: normal Neck: no JVD, carotid bruits, or masses Cardiac: RRR; no murmurs, rubs, or gallops,no edema  Respiratory:  clear to auscultation bilaterally, normal work of breathing GI: soft, nontender, nondistended, + BS MS: no deformity or atrophy Skin: warm and dry, no rash Neuro:  Alert and Oriented x 3, Strength and sensation are intact Psych: euthymic mood, full affect  Wt Readings from Last 3 Encounters:  10/27/15 164 lb 7 oz (74.588 kg)  10/26/15 169 lb 6.4 oz (76.839 kg)    04/13/15 169 lb (76.658 kg)      Studies/Labs Reviewed:   EKG:  EKG is ordered today.  The ekg ordered today demonstrates NSR, QTc 440 ms  Recent Labs: No results found for requested labs within last 365 days.   Lipid Panel    Component Value Date/Time   CHOL 195 09/11/2013 0852   TRIG 82 09/11/2013 0852   HDL 46 09/11/2013 0852   CHOLHDL 4.2 09/11/2013 0852   VLDL 16 09/11/2013 0852   LDLCALC 133* 09/11/2013 0852    ASSESSMENT:    1. Neurocardiogenic syncope   2. Hyperlipidemia      PLAN:  In order of problems listed above:  1. Well controlled with behavioral changes and low dose beta blocker 2. She reports recent lipid profile was good.Will retrieve lab values from her.   Medication Adjustments/Labs and Tests Ordered: Current medicines are reviewed at length with the patient today.  Concerns regarding medicines are outlined above.  Medication changes, Labs and Tests ordered today are listed below. Patient Instructions  Dr Royann Shivers has made no changes today in your current medications or treatment plan.  Your physician recommends that you schedule a follow-up appointment in 12 months. You will receive a reminder letter in the mail two months in advance. If you don't receive a letter, please call our office to schedule the follow-up appointment.  If you need a refill on your cardiac medications before your next appointment, please call your pharmacy.     Joie Bimler, MD  10/29/2015 10:51 PM    [facility] [address] [place], [place], [place]  [address] Phone: [phone]; Fax: [phone]

## 2015-10-27 NOTE — Patient Instructions (Signed)
Dr Croitoru has made no changes today in your current medications or treatment plan.  Your physician recommends that you schedule a follow-up appointment in 12 months. You will receive a reminder letter in the mail two months in advance. If you don't receive a letter, please call our office to schedule the follow-up appointment.  If you need a refill on your cardiac medications before your next appointment, please call your pharmacy. 

## 2015-11-08 ENCOUNTER — Other Ambulatory Visit: Payer: Self-pay | Admitting: Family Medicine

## 2015-11-08 DIAGNOSIS — J069 Acute upper respiratory infection, unspecified: Secondary | ICD-10-CM

## 2015-11-08 MED ORDER — AZITHROMYCIN 250 MG PO TABS: ORAL_TABLET | ORAL | Status: DC

## 2015-11-08 NOTE — Telephone Encounter (Signed)
Patient aware.

## 2015-11-08 NOTE — Telephone Encounter (Signed)
i sent in z ak- should not need another antibiotic.

## 2015-11-08 NOTE — Addendum Note (Signed)
Addended by: Tamera PuntWRAY, WENDY S on: 11/08/2015 04:25 PM   Modules accepted: Orders

## 2015-12-04 ENCOUNTER — Other Ambulatory Visit: Payer: Self-pay | Admitting: Cardiovascular Disease

## 2015-12-06 NOTE — Telephone Encounter (Signed)
Rx request sent to pharmacy.  

## 2016-11-16 ENCOUNTER — Ambulatory Visit (INDEPENDENT_AMBULATORY_CARE_PROVIDER_SITE_OTHER): Payer: BLUE CROSS/BLUE SHIELD | Admitting: Cardiovascular Disease

## 2016-11-16 ENCOUNTER — Encounter: Payer: Self-pay | Admitting: Cardiovascular Disease

## 2016-11-16 VITALS — BP 102/76 | HR 67 | Ht 69.0 in | Wt 162.0 lb

## 2016-11-16 DIAGNOSIS — R55 Syncope and collapse: Secondary | ICD-10-CM | POA: Diagnosis not present

## 2016-11-16 DIAGNOSIS — E785 Hyperlipidemia, unspecified: Secondary | ICD-10-CM | POA: Diagnosis not present

## 2016-11-16 MED ORDER — AZITHROMYCIN 500 MG PO TABS: ORAL_TABLET | ORAL | 0 refills | Status: DC

## 2016-11-16 NOTE — Patient Instructions (Signed)
Dr Royann Shiversroitoru has recommended making the following medication changes: 1. TAKE Azithromycin 500 mg - take 1 tablet daily for 3 days  Your physician recommends that you schedule a follow-up appointment in 1 year with Dr C. You will receive a reminder letter in the mail two months in advance. If you don't receive a letter, please call our office to schedule the follow-up appointment.  If you need a refill on your cardiac medications before your next appointment, please call your pharmacy.   Please send us your lipid panel if you have it!

## 2016-11-16 NOTE — Progress Notes (Signed)
Patient ID: Elizabeth Romero, female   DOB: 05/21/1966, 51 y.o.   MRN: [id number]    Cardiology Office Note    Date:  11/16/2016   ID:  Elizabeth Romero, DOB 04/08/1966, MRN [id number]  PCP:  Kevin FentonSamuel Bradshaw, MD  Cardiologist:    Thurmon FairMihai Antwann Preziosi, MD   Chief Complaint  Patient presents with  . Follow-up    1 year.    History of Present Illness:  Elizabeth Romero is a 51 y.o. female with infrequent neurally mediated syncope (tilt test in 2008 with combined vasodepressor and cardioinhibitory components, intolerant of SSRI, well controlled on low dose beta blockers). No structural heart disease by echo and nuclear perfusion study and no arrhythmia on 30 day event monitor.  She has done well in the last year: no syncope and Only 2 episodes of prodromal symptoms/near syncope.  She is on a statin for hyperlipidemia and reports that her lipid profile was good when drawn non-fasting at a workplace health fair.   She has had "the crud" for the last week with nasal congestion, sinus pressure, low-grade fever and it seems to be worsening rather than improving. She thinks she has a bacterial infection.  The patient specifically denies any chest pain at rest exertion, dyspnea at rest or with exertion, orthopnea, paroxysmal nocturnal dyspnea, syncope, palpitations, focal neurological deficits, intermittent claudication, lower extremity edema, unexplained weight gain, cough, hemoptysis or wheezing.   Past Medical History:  Diagnosis Date  . Allergy   . History of migraine headaches   . Hyperlipidemia   . Neurocardiogenic syncope    H/O    Past Surgical History:  Procedure Laterality Date  . ABDOMINAL HYSTERECTOMY  2012  . NM MYOCAR PERF WALL MOTION  04/10/2012   Low risk scan  . TILT TABLE STUDY  12/18/2006   Positive - documented sinus tach rates up to 162 and dropped abruptly to 45 w/BP drop to 60 w/brief episode of unconsciousness  . [place] ECHOCARDIOGRAPHY  04/27/2010   Normal EF >55%    Current  Outpatient Prescriptions  Medication Sig Dispense Refill  . azithromycin (ZITHROMAX) 500 MG tablet Take 1 tablet by mouth (500 mg total) by mouth daily for 3 days. 3 tablet 0  . fluticasone (FLONASE) 50 MCG/ACT nasal spray Place 2 sprays into the nose daily as needed.    . metoprolol succinate (TOPROL-XL) 25 MG 24 hr tablet TAKE 1 TABLET (25 MG TOTAL) BY MOUTH DAILY. 90 tablet 3  . pantoprazole (PROTONIX) 40 MG tablet Take 1 tablet (40 mg total) by mouth daily as needed. 90 tablet 3  . simvastatin (ZOCOR) 40 MG tablet Take 1 tablet (40 mg total) by mouth every evening. 90 tablet 3   No current facility-administered medications for this visit.     Allergies:   Patient has no known allergies.   Social History   Social History  . Marital status: Married    Spouse name: N/A  . Number of children: N/A  . Years of education: N/A   Social History Main Topics  . Smoking status: Never Smoker  . Smokeless tobacco: Never Used  . Alcohol use No  . Drug use: No  . Sexual activity: Not Asked   Other Topics Concern  . None   Social History Narrative  . None     Family History:  The patient's family history includes Hypertension in her father and sister.   ROS:   Please see the history of present illness.    Review  of Systems  All other systems reviewed and are negative.   PHYSICAL EXAM:   VS:  BP 102/76   Pulse 67   Ht 5\' 9"  (1.753 m)   Wt 73.5 kg (162 lb)   BMI 23.92 kg/m    GEN: Well nourished, well developed, in no acute distress  HEENT: Moderate tenderness to palpation over ethmoid and frontal sinuses Neck: no JVD, carotid bruits, or masses Cardiac: RRR; no murmurs, rubs, or gallops,no edema  Respiratory:  clear to auscultation bilaterally, normal work of breathing GI: soft, nontender, nondistended, + BS MS: no deformity or atrophy  Skin: warm and dry, no rash Neuro:  Alert and Oriented x 3, Strength and sensation are intact Psych: euthymic mood, full affect  Wt  Readings from Last 3 Encounters:  11/16/16 73.5 kg (162 lb)  10/27/15 74.6 kg (164 lb 7 oz)  10/26/15 76.8 kg (169 lb 6.4 oz)      Studies/Labs Reviewed:   EKG:  EKG is ordered today.  The ekg ordered today demonstrates NSR, QTc 440 ms  Recent Labs: No results found for requested labs within last 8760 hours.   Lipid Panel    Component Value Date/Time   CHOL 195 09/11/2013 0852   TRIG 82 09/11/2013 0852   HDL 46 09/11/2013 0852   CHOLHDL 4.2 09/11/2013 0852   VLDL 16 09/11/2013 0852   LDLCALC 133 (H) 09/11/2013 4098    ASSESSMENT:    1. Neurocardiogenic syncope   2. Dyslipidemia      PLAN:  In order of problems listed above:  1. Syncope: Well controlled with behavioral changes and low dose beta blocker. Reminded her of the importance of a relatively high sodium diet, aggressive hydration, pain immediate he did to warning symptoms, supine position to abort syncope. 2. HLP: She reports recent lipid profile was good.Will retrieve lab values from her. If she cannot locate those records, will order a formal lipid profile. 3. Acute sinusitis: azithromycin prescribed.   Medication Adjustments/Labs and Tests Ordered: Current medicines are reviewed at length with the patient today.  Concerns regarding medicines are outlined above.  Medication changes, Labs and Tests ordered today are listed below. Patient Instructions  Dr Royann Shivers has recommended making the following medication changes: 1. TAKE Azithromycin 500 mg - take 1 tablet daily for 3 days  Your physician recommends that you schedule a follow-up appointment in 1 year with Dr C. You will receive a reminder letter in the mail two months in advance. If you don't receive a letter, please call our office to schedule the follow-up appointment.  If you need a refill on your cardiac medications before your next appointment, please call your pharmacy.   Please send [place] your lipid panel if you have it!     Signed, Thurmon Fair, MD  11/16/2016 9:24 AM    [facility] [address] [place], [place], [place]  [address] Phone: [phone]; Fax: [phone]

## 2016-12-13 ENCOUNTER — Telehealth: Payer: Self-pay | Admitting: Cardiovascular Disease

## 2016-12-13 DIAGNOSIS — E785 Hyperlipidemia, unspecified: Secondary | ICD-10-CM

## 2016-12-13 DIAGNOSIS — Z79899 Other long term (current) drug therapy: Secondary | ICD-10-CM

## 2016-12-13 NOTE — Telephone Encounter (Signed)
Attempt to return call-no answer, unable to leave VM, VM full. 

## 2016-12-13 NOTE — Telephone Encounter (Signed)
New Message    Pt states that she could not find her previous lab work records and states that Dr. Salena Saner advised her to call and let us know if she could not so that we could order labs. Pt is letting us know she did not find them.

## 2016-12-14 DIAGNOSIS — Z79899 Other long term (current) drug therapy: Secondary | ICD-10-CM | POA: Insufficient documentation

## 2016-12-14 NOTE — Telephone Encounter (Signed)
SPOKE TO PATIENT,patient aware will mail lab slip for her to go to lab at here convenience. Patient aware she will need to be fasting prior to lab testing.

## 2016-12-18 ENCOUNTER — Other Ambulatory Visit: Payer: Self-pay | Admitting: Cardiovascular Disease

## 2017-01-03 ENCOUNTER — Other Ambulatory Visit: Payer: Self-pay | Admitting: Cardiovascular Disease

## 2017-01-03 NOTE — Telephone Encounter (Signed)
Rx(s) sent to pharmacy electronically.  

## 2017-05-01 ENCOUNTER — Ambulatory Visit (INDEPENDENT_AMBULATORY_CARE_PROVIDER_SITE_OTHER): Payer: BLUE CROSS/BLUE SHIELD | Admitting: Family

## 2017-05-01 ENCOUNTER — Encounter: Payer: Self-pay | Admitting: Family

## 2017-05-01 VITALS — BP 106/70 | HR 76 | Temp 98.4°F | Ht 69.0 in | Wt 163.0 lb

## 2017-05-01 DIAGNOSIS — R399 Unspecified symptoms and signs involving the genitourinary system: Secondary | ICD-10-CM | POA: Diagnosis not present

## 2017-05-01 DIAGNOSIS — M545 Low back pain, unspecified: Secondary | ICD-10-CM

## 2017-05-01 LAB — URINALYSIS, COMPLETE
Bilirubin, UA: NEGATIVE
Glucose, UA: NEGATIVE
Ketones, UA: NEGATIVE
Leukocytes, UA: NEGATIVE
Nitrite, UA: NEGATIVE
Protein, UA: NEGATIVE
RBC, UA: NEGATIVE
Specific Gravity, UA: 1.015 (ref 1.005–1.030)
Urobilinogen, Ur: 1 mg/dL (ref 0.2–1.0)
pH, UA: 8.5 — ABNORMAL HIGH (ref 5.0–7.5)

## 2017-05-01 LAB — MICROSCOPIC EXAMINATION
Epithelial Cells (non renal): >10 /hpf — AB (ref 0–10)
RBC, UA: NONE SEEN /hpf (ref 0–?)
Renal Epithel, UA: NONE SEEN /hpf

## 2017-05-01 MED ORDER — SULFAMETHOXAZOLE-TRIMETHOPRIM 800-160 MG PO TABS: 1.0000 | ORAL_TABLET | Freq: Two times a day (BID) | ORAL | 0 refills | Status: DC

## 2017-05-01 NOTE — Patient Instructions (Signed)

## 2017-05-01 NOTE — Progress Notes (Signed)
   Subjective:    Patient ID: Elizabeth Romero, female    DOB: 09/30/1966, 51 y.o.   MRN: [id number]  Urinary Frequency   This is a new problem. The current episode started in the past 7 days. The problem occurs intermittently. The problem has been waxing and waning. The pain is at a severity of 2/10. The pain is mild. There has been no fever. Associated symptoms include flank pain, frequency, nausea and urgency. Pertinent negatives include no chills, discharge, hematuria, hesitancy or vomiting. She has tried acetaminophen for the symptoms. The treatment provided mild relief.      Review of Systems  Constitutional: Negative for chills.  Gastrointestinal: Positive for nausea. Negative for vomiting.  Genitourinary: Positive for flank pain, frequency and urgency. Negative for hematuria and hesitancy.  All other systems reviewed and are negative.      Objective:   Physical Exam  Constitutional: She is oriented to person, place, and time. She appears well-developed and well-nourished. No distress.  HENT:  Head: Normocephalic.  Eyes: Pupils are equal, round, and reactive to light.  Cardiovascular: Normal rate, regular rhythm, normal heart sounds and intact distal pulses.   No murmur heard. Pulmonary/Chest: Effort normal and breath sounds normal. No respiratory distress. She has no wheezes.  Abdominal: Soft. Bowel sounds are normal. She exhibits no distension. There is no tenderness.  Musculoskeletal: Normal range of motion. She exhibits no edema or tenderness.  Negative CVA tenderness  Neurological: She is alert and oriented to person, place, and time.  Skin: Skin is warm and dry.  Psychiatric: She has a normal mood and affect. Her behavior is normal. Judgment and thought content normal.  Vitals reviewed.    BP 106/70   Pulse 76   Temp 98.4 F (36.9 C) (Oral)   Ht 5\' 9"  (1.753 m)   Wt 163 lb (73.9 kg)   BMI 24.07 kg/m      Assessment & Plan:  1. Acute low back pain without  sciatica, unspecified back pain laterality - Urinalysis, Complete  2. UTI symptoms Force fluids AZO over the counter X2 days RTO prn Culture pending - Urine Culture - sulfamethoxazole-trimethoprim (BACTRIM DS) 800-160 MG tablet; Take 1 tablet by mouth 2 (two) times daily.  Dispense: 14 tablet; Refill: 0    Jannifer Rodneyhristy Davari Lopes, FNP

## 2017-05-02 ENCOUNTER — Telehealth: Payer: Self-pay | Admitting: Family

## 2017-05-02 LAB — URINE CULTURE

## 2017-05-02 NOTE — Telephone Encounter (Signed)
Pt was given Bactrim on 05/01/2017 Took 1 pill yesterday and woke up with extreme arm and leg pain Pain was so severe, pt thought she was going to have to go to ER Could this be possible side effect of med Please advise

## 2017-05-02 NOTE — Telephone Encounter (Signed)
It is not a reaction that I never seen or heard of, medications list 1 million side effects that may have happened once or rare or not common but this is not one that I never heard of for this medication. Go ahead and continue it and it happens again tomorrow then let us know and we will have to change.

## 2017-05-02 NOTE — Telephone Encounter (Signed)
Patient notified to take med one more time and to notify us if she has pain again. She states that her legs are still hurting at times. Patient verbalized understanding

## 2017-05-03 ENCOUNTER — Telehealth: Payer: Self-pay | Admitting: Family

## 2017-05-03 NOTE — Telephone Encounter (Signed)
Refer to previous phone note from yesterday. Patient states that she is still having bilateral leg and arm pain. States she can not even stand due to the pain. Thought it was from the Bactrim but was advised yesterday to keep taking it. [facility] and she was told to stop taking antibiotic since culture was neg. Took it this morning. Is wanting to know if it could be a side effect or if she needs to go to the ER ? Please advise

## 2017-05-03 NOTE — Telephone Encounter (Signed)
Patient aware and verbalizes understanding. States she will have her son take her to the ER.

## 2017-05-03 NOTE — Telephone Encounter (Signed)
If pt can not stand she needs to go to the ED.

## 2017-07-19 ENCOUNTER — Encounter: Payer: Self-pay | Admitting: Family Medicine

## 2017-07-19 ENCOUNTER — Ambulatory Visit (INDEPENDENT_AMBULATORY_CARE_PROVIDER_SITE_OTHER): Payer: BLUE CROSS/BLUE SHIELD | Admitting: Family Medicine

## 2017-07-19 VITALS — BP 103/72 | HR 68 | Temp 97.4°F | Ht 69.0 in | Wt 160.0 lb

## 2017-07-19 DIAGNOSIS — H6982 Other specified disorders of Eustachian tube, left ear: Secondary | ICD-10-CM | POA: Diagnosis not present

## 2017-07-19 NOTE — Patient Instructions (Signed)
Great to see you!  I believe you have eustachian tube dysfunction. Try a neti pot ( the neil med bottle is what I prefer but any are good).   If you are not improving in the next few days please call and we can try a short course of prednisone.

## 2017-07-19 NOTE — Progress Notes (Signed)
   HPI  Patient presents today here with left ear "stopped up".  Patient explains she's had symptoms for 2-3 days. She's been in the hospital for several days with her mother. She denies pain, fevers, chills, congestion, or cough. She has never had similar symptoms.  She denies tinnitus. She does have an automatic echo sound in her left ear.  PMH: Smoking status noted ROS: Per HPI  Objective: BP 103/72 (BP Location: Left Arm)   Pulse 68   Temp (!) 97.4 F (36.3 C) (Oral)   Ht  (1.753 m)   Wt 160 lb (72.6 kg)   LMP 06/28/2017 (Approximate)   BMI 23.63 kg/m  Gen: NAD, alert, cooperative with exam HEENT: NCAT, left TM clearly visible with small effusion, similar effusion on the right, left-sided turbinates swollen CV: RRR, good S1/S2, no murmur Resp: CTABL, no wheezes, non-labored Ext: No edema, warm Neuro: Alert and oriented, No gross deficits  Assessment and plan:  # Eustachian tube dysfunction Patient is arty trying Flonase, recommended trying nasal saline rinses She is unsuccessful I would be glad to give her short course of prednisone. If symptoms persist I recommend ENT referral  Predpak if calling in for it in the next 2-3 days.   Murtis Sink, MD [facility] 07/19/2017, 4:27 PM

## 2017-08-14 ENCOUNTER — Encounter: Payer: Self-pay | Admitting: Family Medicine

## 2017-08-14 ENCOUNTER — Ambulatory Visit (INDEPENDENT_AMBULATORY_CARE_PROVIDER_SITE_OTHER): Payer: BLUE CROSS/BLUE SHIELD | Admitting: Family Medicine

## 2017-08-14 VITALS — BP 111/64 | HR 84 | Temp 98.0°F | Ht 69.0 in | Wt 160.2 lb

## 2017-08-14 DIAGNOSIS — H9203 Otalgia, bilateral: Secondary | ICD-10-CM | POA: Diagnosis not present

## 2017-08-14 MED ORDER — METHYLPREDNISOLONE ACETATE 80 MG/ML IJ SUSP
80.0000 mg | Freq: Once | INTRAMUSCULAR | Status: AC
  Administered 2017-08-14: 80 mg via INTRAMUSCULAR

## 2017-08-14 MED ORDER — AMOXICILLIN 500 MG PO CAPS: 500.0000 mg | ORAL_CAPSULE | Freq: Three times a day (TID) | ORAL | 0 refills | Status: DC

## 2017-08-14 NOTE — Progress Notes (Signed)
   HPI  Patient presents today here with continued ear pain.  Patient reports 3 weeks of ear pain.  She was seen last visit and we diagnosed her with eustachian tube dysfunction.  She has been using Flonase and nasal saline rinses persistently without any improvement.  She reports severe ear pressure bilaterally.  She also reports decreased hearing. She denies any cough, shortness of breath, fever, chills.  She is tolerating food and fluids like usual.  PMH: Smoking status noted ROS: Per HPI  Objective: BP 111/64   Pulse 84   Temp 98 F (36.7 C) (Oral)   Ht 5\' 9"  (1.753 m)   Wt 160 lb 3.2 oz (72.7 kg)   BMI 23.66 kg/m  Gen: NAD, alert, cooperative with exam HEENT: NCAT, effusion bilaterally, no erythema or loss of landmarks, small amount of white scarring on the TM bilaterally as well CV: RRR, good S1/S2, no murmur Resp: CTABL, no wheezes, non-labored Abd: SNTND, BS present, no guarding or organomegaly Ext: No edema, warm Neuro: Alert and oriented, No gross deficits  Assessment and plan:  #Ear pain Bilateral ear pain persistent, effusions are only seen on exam. IM Depo-Medrol given, also amoxicillin after discussion with patient. No signs of true ear infection, however she has had decreased hearing and ear pain for 3 weeks  Meds ordered this encounter  Medications  . methylPREDNISolone acetate (DEPO-MEDROL) injection 80 mg  . amoxicillin (AMOXIL) 500 MG capsule    Sig: Take 1 capsule (500 mg total) by mouth 3 (three) times daily.    Dispense:  30 capsule    Refill:  0    Murtis SinkSam Bradshaw, MD [facility] 08/14/2017, 10:57 AM   '

## 2017-08-14 NOTE — Patient Instructions (Signed)
Great to see you!  Come back or call with any concerns   

## 2017-08-27 ENCOUNTER — Telehealth: Payer: Self-pay | Admitting: Family Medicine

## 2017-08-27 DIAGNOSIS — H9203 Otalgia, bilateral: Secondary | ICD-10-CM

## 2017-08-27 NOTE — Telephone Encounter (Signed)
Patient aware referral placed.

## 2017-08-27 NOTE — Telephone Encounter (Signed)
What type of referral do you need? Ear nose and throat  Have you been seen at our office for this problem? Yes ear issue 08/14/17 Elizabeth Romero (If no, schedule them an appointment.  They will need to be seen before a referral can be done.)  Is there a particular doctor or location that you prefer? no  Patient notified that referrals can take up to a week or longer to process. If they haven't heard anything within a week they should call back and speak with the referral department.

## 2017-08-27 NOTE — Telephone Encounter (Signed)
Referral written.   Appreicate Dr. Avel Sensoreoh's assesment  Murtis SinkSam Bradshaw, MD [facility] 08/27/2017, 10:21 AM

## 2017-12-19 ENCOUNTER — Ambulatory Visit (INDEPENDENT_AMBULATORY_CARE_PROVIDER_SITE_OTHER): Payer: BLUE CROSS/BLUE SHIELD | Admitting: Pediatrics

## 2017-12-19 ENCOUNTER — Encounter: Payer: Self-pay | Admitting: Pediatrics

## 2017-12-19 VITALS — BP 111/64 | HR 85 | Temp 97.3°F | Resp 18 | Ht 69.0 in | Wt 159.0 lb

## 2017-12-19 DIAGNOSIS — J020 Streptococcal pharyngitis: Secondary | ICD-10-CM

## 2017-12-19 DIAGNOSIS — J069 Acute upper respiratory infection, unspecified: Secondary | ICD-10-CM | POA: Diagnosis not present

## 2017-12-19 DIAGNOSIS — J029 Acute pharyngitis, unspecified: Secondary | ICD-10-CM | POA: Diagnosis not present

## 2017-12-19 LAB — RAPID STREP SCREEN (MED CTR MEBANE ONLY): Strep Gp A Ag, IA W/Reflex: POSITIVE — AB

## 2017-12-19 MED ORDER — AMOXICILLIN 500 MG PO CAPS: 500.0000 mg | ORAL_CAPSULE | Freq: Two times a day (BID) | ORAL | 0 refills | Status: AC

## 2017-12-19 MED ORDER — AMOXICILLIN 875 MG PO TABS
875.0000 mg | ORAL_TABLET | Freq: Two times a day (BID) | ORAL | 0 refills | Status: DC
Start: 2017-12-19 — End: 2017-12-19

## 2017-12-19 NOTE — Patient Instructions (Signed)

## 2017-12-19 NOTE — Progress Notes (Signed)
  Subjective:   Patient ID: Elizabeth Romero, female    DOB: 12/03/1965, 52 y.o.   MRN: [id number] CC: Sore Throat; Nasal Congestion; Cough; Shortness of Breath; and Eye redness (left)  HPI: Elizabeth PaymentLisa W Reason is a 52 y.o. female presenting for Sore Throat; Nasal Congestion; Cough; Shortness of Breath; and Eye redness (left)  Started getting sick 6 days ago, started with sore throat. Sore throat still bothering her both with coughing and swallowing. Coughing a lot last night.   Taking some mucinex. Using neti pot. Using flonase, then afrin  Chills this morning. Appetite down, says she has no taste to food.  Relevant past medical, surgical, family and social history reviewed. Allergies and medications reviewed and updated. Social History   Tobacco Use  Smoking Status Never Smoker  Smokeless Tobacco Never Used   ROS: Per HPI   Objective:    BP 111/64   Pulse 85   Temp (!) 97.3 F (36.3 C) (Oral)   Resp 18   Ht 5\' 9"  (1.753 m)   Wt 159 lb (72.1 kg)   SpO2 100%   BMI 23.48 kg/m   Wt Readings from Last 3 Encounters:  12/19/17 159 lb (72.1 kg)  08/14/17 160 lb 3.2 oz (72.7 kg)  07/19/17 160 lb (72.6 kg)    Gen: NAD, alert, cooperative with exam, NCAT EYES: EOMI, no conjunctival injection, or no icterus ENT:  TMs dull gray b/l, OP with erythema CV: NRRR, normal S1/S2, no murmur, distal pulses 2+ b/l Resp: CTABL, no wheezes, normal WOB Abd: +BS, soft, NTND. no guarding or organomegaly Ext: No edema, warm Neuro: Alert and oriented  Assessment & Plan:  Elizabeth StanleyLisa was seen today for sore throat, nasal congestion, cough, shortness of breath and eye redness.  Diagnoses and all orders for this visit:  Acute URI Symptom care and return precautions discussed.   Sore throat Positive rapid strep -     Rapid Strep Screen (Not at [facility])  Strep pharyngitis -     amox 500mg  BID x 10 days   Follow up plan: As needed Rex Krasarol Delise Simenson, MD [facility]

## 2018-01-15 ENCOUNTER — Telehealth: Payer: Self-pay | Admitting: Cardiovascular Disease

## 2018-01-15 NOTE — Telephone Encounter (Signed)
Closed Encounter  °

## 2018-01-19 ENCOUNTER — Other Ambulatory Visit: Payer: Self-pay | Admitting: Cardiovascular Disease

## 2018-01-31 ENCOUNTER — Ambulatory Visit: Payer: BLUE CROSS/BLUE SHIELD | Admitting: Cardiovascular Disease

## 2018-02-05 ENCOUNTER — Ambulatory Visit: Payer: BLUE CROSS/BLUE SHIELD | Admitting: Cardiovascular Disease

## 2018-02-05 ENCOUNTER — Encounter: Payer: Self-pay | Admitting: Cardiovascular Disease

## 2018-02-05 VITALS — BP 110/68 | HR 63 | Ht 69.0 in | Wt 158.0 lb

## 2018-02-05 DIAGNOSIS — R55 Syncope and collapse: Secondary | ICD-10-CM

## 2018-02-05 DIAGNOSIS — E7800 Pure hypercholesterolemia, unspecified: Secondary | ICD-10-CM

## 2018-02-05 NOTE — Progress Notes (Signed)
Patient ID: Elizabeth Romero, female   DOB: 09/19/1966, 52 y.o.   MRN: [id number]    Cardiology Office Note    Date:  02/05/2018   ID:  Elizabeth Romero, DOB 12/24/1965, MRN [id number]  PCP:  [name], Samuel L, MD  Cardiologist:    [name], MD   Chief Complaint  Patient presents with  . Loss of Consciousness    History of Present Illness:  Elizabeth Romero is a 52 y.o. female with infrequent neurally mediated syncope (tilt test in 2008 with combined vasodepressor and cardioinhibitory components, intolerant of SSRI, well controlled on low dose beta blockers). No structural heart disease by echo and nuclear perfusion study and no arrhythmia on 30 day event monitor.  The last 12 months she has not had full-blown syncope but has had 2 near syncopal events.  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  On another occasion she did have to lie on the ground at work to prevent syncope but was able to get back up in a few minutes  She has not had any interval other health problems  The patient specifically denies any chest pain at rest exertion, dyspnea at rest or with exertion, orthopnea, paroxysmal nocturnal dyspnea, syncope, palpitations, focal neurological deficits, intermittent claudication, lower extremity edema, unexplained weight gain, cough, hemoptysis or wheezing.   Past Medical History:  Diagnosis Date  . Allergy   . History of migraine headaches   . Hyperlipidemia   . Neurocardiogenic syncope    H/O    Past Surgical History:  Procedure Laterality Date  . ABDOMINAL HYSTERECTOMY  2012  . NM MYOCAR PERF WALL MOTION  04/10/2012   Low risk scan  . TILT TABLE STUDY  12/18/2006   Positive - documented sinus tach rates up to 162 and dropped abruptly to 45 w/BP drop to 60 w/brief episode of unconsciousness  . US ECHOCARDIOGRAPHY  04/27/2010   Normal EF >55%     Current Outpatient Medications  Medication Sig Dispense Refill  . fluticasone (FLONASE) 50 MCG/ACT nasal spray Place 2 sprays into the nose daily as needed.    . metoprolol succinate (TOPROL-XL) 25 MG 24 hr tablet TAKE 1 TABLET (25 MG TOTAL) BY MOUTH DAILY. 90 tablet 0  . pantoprazole (PROTONIX) 40 MG tablet TAKE 1 TABLET (40 MG TOTAL) BY MOUTH DAILY AS NEEDED. 90 tablet 1   No current facility-administered medications for this visit.     Allergies:   Patient has no known allergies.   Social History   Socioeconomic History  . Marital status: Married    Spouse name: Not on file  . Number of children: Not on file  . Years of education: Not on file  . Highest education level: Not on file  Occupational History  . Not on file  Social Needs  . Financial resource strain: Not on file  . Food insecurity:    Worry: Not on file    Inability: Not on file  . Transportation needs:    Medical: Not on file    Non-medical: Not on file  Tobacco Use  . Smoking status: Never Smoker  . Smokeless tobacco: Never Used  Substance and Sexual Activity  . Alcohol use: No    Alcohol/week: 0.0 oz  . Drug use: No  . Sexual activity: Not on file  Lifestyle  . Physical activity:  Days per week: Not on file    Minutes per session: Not on file  . Stress: Not on file  Relationships  . Social connections:    Talks on phone: Not on file    Gets together: Not on file    Attends religious service: Not on file    Active member of club or organization: Not on file    Attends meetings of clubs or organizations: Not on file    Relationship status: Not on file  Other Topics Concern  . Not on file  Social History Narrative  . Not on file     Family History:  The patient's family history includes Hypertension in her father and sister.   ROS:   Please see the history of present illness.    Review of Systems  All other systems reviewed and are negative.   PHYSICAL EXAM:   VS:  BP 110/68    Pulse 63   Ht 5\' 9"  (1.753 m)   Wt 158 lb (71.7 kg)   LMP 01/09/2018 (Approximate)   BMI 23.33 kg/m     General: Alert, oriented x3, no distress, appears lean and fit Head: no evidence of trauma, PERRL, EOMI, no exophtalmos or lid lag, no myxedema, no xanthelasma; normal ears, nose and oropharynx Neck: normal jugular venous pulsations and no hepatojugular reflux; brisk carotid pulses without delay and no carotid bruits Chest: clear to auscultation, no signs of consolidation by percussion or palpation, normal fremitus, symmetrical and full respiratory excursions Cardiovascular: normal position and quality of the apical impulse, regular rhythm, normal first and second heart sounds, no murmurs, rubs or gallops Abdomen: no tenderness or distention, no masses by palpation, no abnormal pulsatility or arterial bruits, normal bowel sounds, no hepatosplenomegaly Extremities: no clubbing, cyanosis or edema; 2+ radial, ulnar and brachial pulses bilaterally; 2+ right femoral, posterior tibial and dorsalis pedis pulses; 2+ left femoral, posterior tibial and dorsalis pedis pulses; no subclavian or femoral bruits Neurological: grossly nonfocal Psych: Normal mood and affect   Wt Readings from Last 3 Encounters:  02/05/18 158 lb (71.7 kg)  12/19/17 159 lb (72.1 kg)  08/14/17 160 lb 3.2 oz (72.7 kg)      Studies/Labs Reviewed:   EKG:  EKG is ordered today.  The ekg ordered today demonstrates rhythm, normal tracing, QTC 423 ms  Recent Labs: No results found for requested labs within last 8760 hours.   Lipid Panel    Component Value Date/Time   CHOL 195 09/11/2013 0852   TRIG 82 09/11/2013 0852   HDL 46 09/11/2013 0852   CHOLHDL 4.2 09/11/2013 0852   VLDL 16 09/11/2013 0852   LDLCALC 133 (H) 09/11/2013 4098    ASSESSMENT:    1. Neurocardiogenic syncope   2. Hypercholesterolemia      PLAN:  In order of problems listed above:  1. Syncope: Reviewed the importance of adequate hydration,  relatively high salt diet, avoiding triggers, promptly pain he needs to prodromal symptoms to avoid full-blown syncope and injury.  Reviewed the way that beta-blockers worked to prevent neurally mediated syncope. 2. HLP: On statin, followed by her primary care physician    Medication Adjustments/Labs and Tests Ordered: Current medicines are reviewed at length with the patient today.  Concerns regarding medicines are outlined above.  Medication changes, Labs and Tests ordered today are listed below. Patient Instructions  Dr Royann Shivers recommends that you schedule a follow-up appointment in 12 months. You will receive a reminder letter in the mail two months in  advance. If you don't receive a letter, please call our office to schedule the follow-up appointment.  If you need a refill on your cardiac medications before your next appointment, please call your pharmacy.     Signed, Thurmon Fair, MD  02/05/2018 3:20 PM    [facility] [address] [place], [place], [place]  [address] Phone: [phone]; Fax: [phone]

## 2018-02-05 NOTE — Patient Instructions (Signed)
Dr Croitoru recommends that you schedule a follow-up appointment in 12 months. You will receive a reminder letter in the mail two months in advance. If you don't receive a letter, please call our office to schedule the follow-up appointment.  If you need a refill on your cardiac medications before your next appointment, please call your pharmacy. 

## 2018-04-01 ENCOUNTER — Ambulatory Visit: Payer: BLUE CROSS/BLUE SHIELD | Admitting: Family

## 2018-04-01 ENCOUNTER — Encounter: Payer: Self-pay | Admitting: Family

## 2018-04-01 VITALS — BP 93/58 | HR 70 | Temp 97.2°F | Ht 69.0 in | Wt 159.8 lb

## 2018-04-01 DIAGNOSIS — M545 Low back pain, unspecified: Secondary | ICD-10-CM

## 2018-04-01 DIAGNOSIS — N3001 Acute cystitis with hematuria: Secondary | ICD-10-CM | POA: Diagnosis not present

## 2018-04-01 LAB — URINALYSIS, COMPLETE
Glucose, UA: NEGATIVE
Ketones, UA: NEGATIVE
Leukocytes, UA: NEGATIVE
Nitrite, UA: NEGATIVE
Protein, UA: NEGATIVE
RBC, UA: NEGATIVE
Specific Gravity, UA: 1.015 (ref 1.005–1.030)
Urobilinogen, Ur: 0.2 mg/dL (ref 0.2–1.0)
pH, UA: 7.5 (ref 5.0–7.5)

## 2018-04-01 LAB — MICROSCOPIC EXAMINATION: Renal Epithel, UA: NONE SEEN /hpf

## 2018-04-01 MED ORDER — NITROFURANTOIN MONOHYD MACRO 100 MG PO CAPS: 100.0000 mg | ORAL_CAPSULE | Freq: Two times a day (BID) | ORAL | 0 refills | Status: DC

## 2018-04-01 NOTE — Progress Notes (Signed)
   Subjective:    Patient ID: Elizabeth Romero, female    DOB: 11/24/1965, 52 y.o.   MRN: [id number]  Chief Complaint  Patient presents with  . pressure lower abdomen  . Back Pain    Back Pain  This is a new problem. The current episode started yesterday. The problem occurs constantly. The problem is unchanged (flank pain). Radiates to: lower abd pressure. The pain is at a severity of 8/10. The pain is mild. Pertinent negatives include no bladder incontinence, bowel incontinence or dysuria.  Urinary Frequency   Associated symptoms include flank pain, frequency, nausea and urgency. Pertinent negatives include no discharge, hematuria, hesitancy or vomiting. She has tried increased fluids for the symptoms. The treatment provided no relief.      Review of Systems  Gastrointestinal: Positive for nausea. Negative for bowel incontinence and vomiting.  Genitourinary: Positive for flank pain, frequency and urgency. Negative for bladder incontinence, dysuria, hematuria and hesitancy.  Musculoskeletal: Positive for back pain.  All other systems reviewed and are negative.      Objective:   Physical Exam  Constitutional: She is oriented to person, place, and time. She appears well-developed and well-nourished. No distress.  HENT:  Head: Normocephalic and atraumatic.  Right Ear: External ear normal.  Left Ear: External ear normal.  Mouth/Throat: Oropharynx is clear and moist.  Eyes: Pupils are equal, round, and reactive to light.  Neck: Normal range of motion. Neck supple. No thyromegaly present.  Cardiovascular: Normal rate, regular rhythm, normal heart sounds and intact distal pulses.  No murmur heard. Pulmonary/Chest: Effort normal and breath sounds normal. No respiratory distress. She has no wheezes.  Abdominal: Soft. Bowel sounds are normal. She exhibits no distension. There is tenderness (mild lower abd).  Musculoskeletal: Normal range of motion. She exhibits no edema or tenderness.    Negative CVA tenderness  Neurological: She is alert and oriented to person, place, and time. She has normal reflexes. No cranial nerve deficit.  Skin: Skin is warm and dry.  Psychiatric: She has a normal mood and affect. Her behavior is normal. Judgment and thought content normal.  Vitals reviewed.   BP (!) 93/58   Pulse 70   Temp (!) 97.2 F (36.2 C) (Oral)   Ht 5\' 9"  (1.753 m)   Wt 159 lb 12.8 oz (72.5 kg)   BMI 23.60 kg/m        Assessment & Plan:  1. Acute low back pain without sciatica, unspecified back pain laterality - Urinalysis, Complete  2. Acute cystitis with hematuria Force fluids AZO over the counter X2 days RTO prn Culture pending - nitrofurantoin, macrocrystal-monohydrate, (MACROBID) 100 MG capsule; Take 1 capsule (100 mg total) by mouth 2 (two) times daily.  Dispense: 10 capsule; Refill: 0 - Urine Culture    Jannifer Rodneyhristy Octavis Sheeler, FNP

## 2018-04-01 NOTE — Patient Instructions (Signed)

## 2018-04-02 LAB — URINE CULTURE: Organism ID, Bacteria: NO GROWTH

## 2018-04-25 ENCOUNTER — Other Ambulatory Visit: Payer: Self-pay | Admitting: Cardiovascular Disease

## 2019-02-05 ENCOUNTER — Telehealth: Payer: Self-pay

## 2019-02-05 NOTE — Telephone Encounter (Signed)
Virtual Visit Pre-Appointment Phone Call  Steps For Call:  1. Confirm consent - "In the setting of the current Covid19 crisis, you are scheduled for a (phone or video) visit with your provider on (date) at (time).  Just as we do with many in-office visits, in order for you to participate in this visit, we must obtain consent.  If you'd like, I can send this to your mychart (if signed up) or email for you to review.  Otherwise, I can obtain your verbal consent now.  All virtual visits are billed to your insurance company just like a normal visit would be.  By agreeing to a virtual visit, we'd like you to understand that the technology does not allow for your provider to perform an examination, and thus may limit your provider's ability to fully assess your condition.  Finally, though the technology is pretty good, we cannot assure that it will always work on either your or our end, and in the setting of a video visit, we may have to convert it to a phone-only visit.  In either situation, we cannot ensure that we have a secure connection.  Are you willing to proceed?" STAFF: Did the patient verbally acknowledge consent to telehealth visit? Document YES/NO here: NO  2. Confirm the BEST phone number to call the day of the visit by including in appointment notes  3. Give patient instructions for WebEx/MyChart download to smartphone as below or Doximity/Doxy.me if video visit (depending on what platform provider is using)  4. Advise patient to be prepared with their blood pressure, heart rate, weight, any heart rhythm information, their current medicines, and a piece of paper and pen handy for any instructions they may receive the day of their visit  5. Inform patient they will receive a phone call 15 minutes prior to their appointment time (may be from unknown caller ID) so they should be prepared to answer  6. Confirm that appointment type is correct in Epic appointment notes (VIDEO vs PHONE)     TELEPHONE CALL NOTE  Elizabeth Romero has been deemed a candidate for a follow-up tele-health visit to limit community exposure during the Covid-19 pandemic. I spoke with the patient via phone to ensure availability of phone/video source, confirm preferred email & phone number, and discuss instructions and expectations.  I reminded Elizabeth Romero to be prepared with any vital sign and/or heart rhythm information that could potentially be obtained via home monitoring, at the time of her visit. I reminded Elizabeth Romero to expect a phone call at the time of her visit if her visit.  Daymond Cordts, [place], CMA 02/05/2019 2:35 PM   INSTRUCTIONS FOR DOWNLOADING THE WEBEX APP TO SMARTPHONE  - If Apple, ask patient to go to [employer] and type in WebEx in the search bar. Download [employer], the blue/green circle. If Android, go to [employer] and type in [employer] in the search bar. The app is free but as with any other app downloads, their phone may require them to verify saved payment information or Apple/Android password.  - The patient does NOT have to create an account. - On the day of the visit, the assist will walk the patient through joining the meeting with the meeting number/password.  INSTRUCTIONS FOR DOWNLOADING THE MYCHART APP TO SMARTPHONE  - The patient must first make sure to have activated MyChart and know their login information - If Apple, go to [employer] and type in [employer]  in the search bar and download the app. If Android, ask patient to go to [employer] and type in [place] in the search bar and download the app. The app is free but as with any other app downloads, their phone may require them to verify saved payment information or Apple/Android password.  - The patient will need to then log into the app with their MyChart username and password, and select [facility] as their healthcare provider to link the account. When it is time for your visit, go to the MyChart app,  find appointments, and click Begin Video Visit. Be sure to Select Allow for your device to access the Microphone and Camera for your visit. You will then be connected, and your provider will be with you shortly.  **If they have any issues connecting, or need assistance please contact MyChart service desk [phone])**  **If using a computer, in order to ensure the best quality for their visit they will need to use either of the following Internet Browsers: [employer], or Google Chrome**  IF USING DOXIMITY or DOXY.ME - The patient will receive a link just prior to their visit, either by text or email (to be determined day of appointment depending on if it's doxy.me or Doximity).     FULL LENGTH CONSENT FOR TELE-HEALTH VISIT   I hereby voluntarily request, consent and authorize [facility] and its employed or contracted physicians, physician assistants, nurse practitioners or other licensed health care professionals (the Practitioner), to provide me with telemedicine health care services (the "Services") as deemed necessary by the treating Practitioner. I acknowledge and consent to receive the Services by the Practitioner via telemedicine. I understand that the telemedicine visit will involve communicating with the Practitioner through live audiovisual communication technology and the disclosure of certain medical information by electronic transmission. I acknowledge that I have been given the opportunity to request an in-person assessment or other available alternative prior to the telemedicine visit and am voluntarily participating in the telemedicine visit.  I understand that I have the right to withhold or withdraw my consent to the use of telemedicine in the course of my care at any time, without affecting my right to future care or treatment, and that the Practitioner or I may terminate the telemedicine visit at any time. I understand that I have the right to inspect all  information obtained and/or recorded in the course of the telemedicine visit and may receive copies of available information for a reasonable fee.  I understand that some of the potential risks of receiving the Services via telemedicine include:  Marland Kitchen Delay or interruption in medical evaluation due to technological equipment failure or disruption; . Information transmitted may not be sufficient (e.g. poor resolution of images) to allow for appropriate medical decision making by the Practitioner; and/or  . In rare instances, security protocols could fail, causing a breach of personal health information.  Furthermore, I acknowledge that it is my responsibility to provide information about my medical history, conditions and care that is complete and accurate to the best of my ability. I acknowledge that Practitioner's advice, recommendations, and/or decision may be based on factors not within their control, such as incomplete or inaccurate data provided by me or distortions of diagnostic images or specimens that may result from electronic transmissions. I understand that the practice of medicine is not an exact science and that Practitioner makes no warranties or guarantees regarding treatment outcomes. I acknowledge that I will receive a copy of  this consent concurrently upon execution via email to the email address I last provided but may also request a printed copy by calling the office of [facility].    I understand that my insurance will be billed for this visit.   I have read or had this consent read to me. . I understand the contents of this consent, which adequately explains the benefits and risks of the Services being provided via telemedicine.  . I have been provided ample opportunity to ask questions regarding this consent and the Services and have had my questions answered to my satisfaction. . I give my informed consent for the services to be provided through the use of telemedicine in my  medical care  By participating in this telemedicine visit I agree to the above.

## 2019-02-10 ENCOUNTER — Telehealth: Payer: Self-pay | Admitting: Cardiovascular Disease

## 2019-02-10 NOTE — Telephone Encounter (Signed)
SMARTPHONE/ MY CHART PENDING/ VIRTUAL CONSENT/ PRE REG COMPLETED

## 2019-02-11 ENCOUNTER — Telehealth: Payer: Self-pay | Admitting: *Deleted

## 2019-02-11 ENCOUNTER — Encounter: Payer: Self-pay | Admitting: *Deleted

## 2019-02-11 ENCOUNTER — Telehealth (INDEPENDENT_AMBULATORY_CARE_PROVIDER_SITE_OTHER): Payer: BLUE CROSS/BLUE SHIELD | Admitting: Cardiovascular Disease

## 2019-02-11 DIAGNOSIS — R55 Syncope and collapse: Secondary | ICD-10-CM

## 2019-02-11 DIAGNOSIS — E7800 Pure hypercholesterolemia, unspecified: Secondary | ICD-10-CM | POA: Diagnosis not present

## 2019-02-11 MED ORDER — METOPROLOL SUCCINATE ER 25 MG PO TB24: 25.0000 mg | ORAL_TABLET | Freq: Every day | ORAL | 3 refills | Status: DC

## 2019-02-11 NOTE — Patient Instructions (Addendum)
Medication Instructions:  Continue same medications  Metoprolol refill sent to your pharmacy If you need a refill on your cardiac medications before your next appointment, please call your pharmacy.   Lab work: None ordered   Testing/Procedures: None ordered  Follow-Up: At [facility], you and your health needs are our priority.  As part of our continuing mission to provide you with exceptional heart care, we have created designated Provider Care Teams.  These Care Teams include your primary Cardiologist (physician) and Advanced Practice Providers (APPs -  Physician Assistants and Nurse Practitioners) who all work together to provide you with the care you need, when you need it. . Schedule follow up appointment with Dr.Croitoru in 12 months    Call 3 months before to schedule

## 2019-02-11 NOTE — Telephone Encounter (Signed)
Cardiac Questionnaire:    Since your last visit or hospitalization:    1. Have you been having new or worsening chest pain? NO   2. Have you been having new or worsening shortness of breath? NO 3. Have you been having new or worsening leg swelling, wt gain, or increase in abdominal girth (pants fitting more tightly)? NO   4. Have you had any passing out spells? NO    *A YES to any of these questions would result in the appointment being kept. *If all the answers to these questions are NO, we should indicate that given the current situation regarding the worldwide coronarvirus pandemic, at the recommendation of the CDC, we are looking to limit gatherings in our waiting area, and thus will reschedule their appointment beyond four weeks from today.   _____________   COVID-19 Pre-Screening Questions:  . Do you currently have a fever? NO . Have you recently travelled on a cruise, internationally, or to [place], [place], [place], [place], [place], or [place] [employer] ? NO . Have you been in contact with someone that is currently pending confirmation of Covid19 testing or has been confirmed to have the Covid19 virus? NO . Are you currently experiencing fatigue or cough? NO           Virtual Visit Pre-Appointment Phone Call  Steps For Call:  1. Confirm consent - "In the setting of the current Covid19 crisis, you are scheduled for a (phone or video) visit with your provider on (date) at (time).  Just as we do with many in-office visits, in order for you to participate in this visit, we must obtain consent.  If you'd like, I can send this to your mychart (if signed up) or email for you to review.  Otherwise, I can obtain your verbal consent now.  All virtual visits are billed to your insurance company just like a normal visit would be.  By agreeing to a virtual visit, we'd like you to understand that the technology does not allow for your provider to perform an examination, and thus may limit your  provider's ability to fully assess your condition. If your provider identifies any concerns that need to be evaluated in person, we will make arrangements to do so.  Finally, though the technology is pretty good, we cannot assure that it will always work on either your or our end, and in the setting of a video visit, we may have to convert it to a phone-only visit.  In either situation, we cannot ensure that we have a secure connection.  Are you willing to proceed?" STAFF: Did the patient verbally acknowledge consent to telehealth visit? Document YES/NO here: YES  2. Confirm the BEST phone number to call the day of the visit by including in appointment notes  3. Give patient instructions for MyChart download to smartphone OR Doximity/Doxy.me as below if video visit (depending on what platform provider is using)  4. Confirm that appointment type is correct in Epic appointment notes (VIDEO vs PHONE)  5. Advise patient to be prepared with their blood pressure, heart rate, weight, any heart rhythm information, their current medicines, and a piece of paper and pen handy for any instructions they may receive the day of their visit  6. Inform patient they will receive a phone call 15 minutes prior to their appointment time (may be from unknown caller ID) so they should be prepared to answer    TELEPHONE CALL NOTE  Elizabeth Romero has been  deemed a candidate for a follow-up tele-health visit to limit community exposure during the Covid-19 pandemic. I spoke with the patient via phone to ensure availability of phone/video source, confirm preferred email & phone number, and discuss instructions and expectations.  I reminded Elizabeth Romero to be prepared with any vital sign and/or heart rhythm information that could potentially be obtained via home monitoring, at the time of her visit. I reminded Elizabeth Romero to expect a phone call prior to her visit.  Raelyn Number, CMA 02/11/2019 7:49 AM    INSTRUCTIONS FOR DOWNLOADING THE MYCHART APP TO SMARTPHONE  - The patient must first make sure to have activated MyChart and know their login information - If Apple, go to [employer] and type in MyChart in the search bar and download the app. If Android, ask patient to go to [employer] and type in [place] in the search bar and download the app. The app is free but as with any other app downloads, their phone may require them to verify saved payment information or Apple/Android password.  - The patient will need to then log into the app with their MyChart username and password, and select [facility] as their healthcare provider to link the account. When it is time for your visit, go to the MyChart app, find appointments, and click Begin Video Visit. Be sure to Select Allow for your device to access the Microphone and Camera for your visit. You will then be connected, and your provider will be with you shortly.  **If they have any issues connecting, or need assistance please contact MyChart service desk [phone])**  **If using a computer, in order to ensure the best quality for their visit they will need to use either of the following Internet Browsers: [employer], or Google Chrome**  IF USING DOXIMITY or DOXY.ME - The patient will receive a link just prior to their visit by text.     FULL LENGTH CONSENT FOR TELE-HEALTH VISIT   I hereby voluntarily request, consent and authorize [facility] and its employed or contracted physicians, physician assistants, nurse practitioners or other licensed health care professionals (the Practitioner), to provide me with telemedicine health care services (the "Services") as deemed necessary by the treating Practitioner. I acknowledge and consent to receive the Services by the Practitioner via telemedicine. I understand that the telemedicine visit will involve communicating with the Practitioner through live audiovisual communication  technology and the disclosure of certain medical information by electronic transmission. I acknowledge that I have been given the opportunity to request an in-person assessment or other available alternative prior to the telemedicine visit and am voluntarily participating in the telemedicine visit.  I understand that I have the right to withhold or withdraw my consent to the use of telemedicine in the course of my care at any time, without affecting my right to future care or treatment, and that the Practitioner or I may terminate the telemedicine visit at any time. I understand that I have the right to inspect all information obtained and/or recorded in the course of the telemedicine visit and may receive copies of available information for a reasonable fee.  I understand that some of the potential risks of receiving the Services via telemedicine include:  Marland Kitchen Delay or interruption in medical evaluation due to technological equipment failure or disruption; . Information transmitted may not be sufficient (e.g. poor resolution of images) to allow for appropriate medical decision making by the Practitioner; and/or  .  In rare instances, security protocols could fail, causing a breach of personal health information.  Furthermore, I acknowledge that it is my responsibility to provide information about my medical history, conditions and care that is complete and accurate to the best of my ability. I acknowledge that Practitioner's advice, recommendations, and/or decision may be based on factors not within their control, such as incomplete or inaccurate data provided by me or distortions of diagnostic images or specimens that may result from electronic transmissions. I understand that the practice of medicine is not an exact science and that Practitioner makes no warranties or guarantees regarding treatment outcomes. I acknowledge that I will receive a copy of this consent concurrently upon execution via email to the  email address I last provided but may also request a printed copy by calling the office of [facility].    I understand that my insurance will be billed for this visit.   I have read or had this consent read to me. . I understand the contents of this consent, which adequately explains the benefits and risks of the Services being provided via telemedicine.  . I have been provided ample opportunity to ask questions regarding this consent and the Services and have had my questions answered to my satisfaction. . I give my informed consent for the services to be provided through the use of telemedicine in my medical care  By participating in this telemedicine visit I agree to the above.

## 2019-02-11 NOTE — Progress Notes (Signed)
Virtual Visit via Video Note   This visit type was conducted due to national recommendations for restrictions regarding the COVID-19 Pandemic (e.g. social distancing) in an effort to limit this patient's exposure and mitigate transmission in our community.  Due to her co-morbid illnesses, this patient is at least at moderate risk for complications without adequate follow up.  This format is felt to be most appropriate for this patient at this time.  All issues noted in this document were discussed and addressed.  A limited physical exam was performed with this format.  Please refer to the patient's chart for her consent to telehealth for [facility].   Evaluation Performed:  Follow-up visit  Date:  02/11/2019   ID:  Elizabeth Romero, DOB 07/08/1966, MRN [id number]  Patient Location: Home Provider Location: Home  PCP:  [name], Samuel L, MD (Inactive)  Cardiologist:  Rashonda Warrior Electrophysiologist:  None   Chief Complaint: History of syncope  History of Present Illness:    Elizabeth PaymentLisa W Romero is a 53 y.o. female with a history of vasovagal syncope and mild hypercholesterolemia.  From a personal standpoint she has had a good year of health, without major problems.  She has not had any full-blown syncope.  About a month ago she had one near syncopal event where everything seemed to "go black" while she was just walking through the house.  She felt weak but did not have nausea, diaphoresis or full-blown syncope.  She improved after laying down on her couch, but did feel weak for the remainder of the day.  She is not sure what triggered that event.  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  She has a copy of the lab somewhere, but is unable to retrieve them now.  The patient specifically denies any chest pain at rest exertion, dyspnea at rest or with exertion, orthopnea, paroxysmal  nocturnal dyspnea, syncope, palpitations, focal neurological deficits, intermittent claudication, lower extremity edema, unexplained weight gain, cough, hemoptysis or wheezing.  She reports that her blood pressure is typically in the 100/60 range.  The patient does not have symptoms concerning for COVID-19 infection (fever, chills, cough, or new shortness of breath).    Past Medical History:  Diagnosis Date  . Allergy   . History of migraine headaches   . Hyperlipidemia   . Neurocardiogenic syncope    H/O   Past Surgical History:  Procedure Laterality Date  . ABDOMINAL HYSTERECTOMY  2012  . NM MYOCAR PERF WALL MOTION  04/10/2012   Low risk scan  . TILT TABLE STUDY  12/18/2006   Positive - documented sinus tach rates up to 162 and dropped abruptly to 45 w/BP drop to 60 w/brief episode of unconsciousness  . US ECHOCARDIOGRAPHY  04/27/2010   Normal EF >55%     Current Meds  Medication Sig  . fluticasone (FLONASE) 50 MCG/ACT nasal spray Place 2 sprays into the nose daily as needed.  . metoprolol succinate (TOPROL-XL) 25 MG 24 hr tablet TAKE 1 TABLET (25 MG TOTAL) BY MOUTH DAILY.  . pantoprazole (PROTONIX) 40 MG tablet TAKE 1 TABLET (40 MG TOTAL) BY MOUTH DAILY AS NEEDED.     Allergies:   Patient has no known allergies.   Social History   Tobacco Use  . Smoking status: Never Smoker  . Smokeless tobacco: Never Used  Substance Use Topics  . Alcohol use: No  Alcohol/week: 0.0 standard drinks  . Drug use: No     Family Hx: The patient's family history includes Hypertension in her father and sister.  ROS:   Please see the history of present illness.     All other systems reviewed and are negative.   Prior CV studies:   The following studies were reviewed today:  n/a  Labs/Other Tests and Data Reviewed:    EKG:  An ECG dated 02/05/2018 was personally reviewed today and demonstrated:  Normal sinus rhythm, normal tracing  Recent Labs: No results found for requested  labs within last 8760 hours.   Recent Lipid Panel Lab Results  Component Value Date/Time   CHOL 195 09/11/2013 08:52 AM   TRIG 82 09/11/2013 08:52 AM   HDL 46 09/11/2013 08:52 AM   CHOLHDL 4.2 09/11/2013 08:52 AM   LDLCALC 133 (H) 09/11/2013 08:52 AM    Wt Readings from Last 3 Encounters:  02/11/19 161 lb (73 kg)  04/01/18 159 lb 12.8 oz (72.5 kg)  02/05/18 158 lb (71.7 kg)     Objective:    Vital Signs:  BP 94/63   Pulse 65   Ht 5\' 10"  (1.778 m)   Wt 161 lb (73 kg)   BMI 23.10 kg/m    VITAL SIGNS:  reviewed GEN:  no acute distress EYES:  sclerae anicteric, EOMI - Extraocular Movements Intact RESPIRATORY:  normal respiratory effort, symmetric expansion CARDIOVASCULAR:  reports regular rhythm SKIN:  no rash, lesions or ulcers. MUSCULOSKELETAL:  no obvious deformities. NEURO:  alert and oriented x 3, no obvious focal deficit PSYCH:  normal affect    ASSESSMENT & PLAN:    1. Neurocardiogenic syncope: Only one near syncopal events in the last 12 months, she is able to recognize the prodromal symptoms and avoid full-blown syncope.  Some benefit is apparent from low-dose metoprolol, but she will not tolerate higher doses due to relative hypotension and bradycardia.  Reminded her to stay very well-hydrated and encouraged a relatively sodium rich diet. 2. HLP: No recent lipid profile available, but labs were "great" by her report when last checked at a health fair.  No underlying known coronary or vascular disease.  COVID-19 Education: The signs and symptoms of COVID-19 were discussed with the patient and how to seek care for testing (follow up with PCP or arrange E-visit).  The importance of social distancing was discussed today.  Time:   Today, I have spent 16 minutes with the patient with telehealth technology discussing the above problems.     Medication Adjustments/Labs and Tests Ordered: Current medicines are reviewed at length with the patient today.  Concerns  regarding medicines are outlined above.   Tests Ordered: No orders of the defined types were placed in this encounter.   Medication Changes: No orders of the defined types were placed in this encounter.   Disposition:  Follow up 12 months  Signed, Thurmon Fair, MD  02/11/2019 8:41 AM    [facility]

## 2019-03-18 ENCOUNTER — Telehealth: Payer: Self-pay | Admitting: Family Medicine

## 2019-03-19 ENCOUNTER — Other Ambulatory Visit: Payer: Self-pay

## 2019-03-19 ENCOUNTER — Ambulatory Visit (INDEPENDENT_AMBULATORY_CARE_PROVIDER_SITE_OTHER): Payer: BLUE CROSS/BLUE SHIELD | Admitting: Family Medicine

## 2019-03-19 ENCOUNTER — Encounter: Payer: Self-pay | Admitting: Family Medicine

## 2019-03-19 DIAGNOSIS — N3000 Acute cystitis without hematuria: Secondary | ICD-10-CM

## 2019-03-19 MED ORDER — CEPHALEXIN 500 MG PO CAPS: 500.0000 mg | ORAL_CAPSULE | Freq: Two times a day (BID) | ORAL | 0 refills | Status: AC

## 2019-03-19 NOTE — Patient Instructions (Signed)
Urinary Tract Infection, Adult A urinary tract infection (UTI) is an infection of any part of the urinary tract. The urinary tract includes:  The kidneys.  The ureters.  The bladder.  The urethra. These organs make, store, and get rid of pee (urine) in the body. What are the causes? This is caused by germs (bacteria) in your genital area. These germs grow and cause swelling (inflammation) of your urinary tract. What increases the risk? You are more likely to develop this condition if:  You have a small, thin tube (catheter) to drain pee.  You cannot control when you pee or poop (incontinence).  You are female, and: ? You use these methods to prevent pregnancy: ? A medicine that kills sperm (spermicide). ? A device that blocks sperm (diaphragm). ? You have low levels of a female hormone (estrogen). ? You are pregnant.  You have genes that add to your risk.  You are sexually active.  You take antibiotic medicines.  You have trouble peeing because of: ? A prostate that is bigger than normal, if you are female. ? A blockage in the part of your body that drains pee from the bladder (urethra). ? A kidney stone. ? A nerve condition that affects your bladder (neurogenic bladder). ? Not getting enough to drink. ? Not peeing often enough.  You have other conditions, such as: ? Diabetes. ? A weak disease-fighting system (immune system). ? Sickle cell disease. ? Gout. ? Injury of the spine. What are the signs or symptoms? Symptoms of this condition include:  Needing to pee right away (urgently).  Peeing often.  Peeing small amounts often.  Pain or burning when peeing.  Blood in the pee.  Pee that smells bad or not like normal.  Trouble peeing.  Pee that is cloudy.  Fluid coming from the vagina, if you are female.  Pain in the belly or lower back. Other symptoms include:  Throwing up (vomiting).  No urge to eat.  Feeling mixed up (confused).  Being tired  and grouchy (irritable).  A fever.  Watery poop (diarrhea). How is this treated? This condition may be treated with:  Antibiotic medicine.  Other medicines.  Drinking enough water. Follow these instructions at home:  Medicines  Take over-the-counter and prescription medicines only as told by your doctor.  If you were prescribed an antibiotic medicine, take it as told by your doctor. Do not stop taking it even if you start to feel better. General instructions  Make sure you: ? Pee until your bladder is empty. ? Do not hold pee for a long time. ? Empty your bladder after sex. ? Wipe from front to back after pooping if you are a female. Use each tissue one time when you wipe.  Drink enough fluid to keep your pee pale yellow.  Keep all follow-up visits as told by your doctor. This is important. Contact a doctor if:  You do not get better after 1-2 days.  Your symptoms go away and then come back. Get help right away if:  You have very bad back pain.  You have very bad pain in your lower belly.  You have a fever.  You are sick to your stomach (nauseous).  You are throwing up. Summary  A urinary tract infection (UTI) is an infection of any part of the urinary tract.  This condition is caused by germs in your genital area.  There are many risk factors for a UTI. These include having a small, thin   tube to drain pee and not being able to control when you pee or poop.  Treatment includes antibiotic medicines for germs.  Drink enough fluid to keep your pee pale yellow. This information is not intended to replace advice given to you by your health care provider. Make sure you discuss any questions you have with your health care provider. Document Released: 03/27/2008 Document Revised: 04/18/2018 Document Reviewed: 04/18/2018 Elsevier Interactive Patient Education  2019 [employer]vier Inc[employer]  

## 2019-03-19 NOTE — Progress Notes (Signed)
Telephone visit  Subjective: CC: ?UTI PCP: Junie Spencer, FNP Elizabeth Romero is a 53 y.o. female calls for telephone consult today. Patient provides verbal consent for consult held via phone.  Location of patient: home Location of provider: [facility] Others present for call: none  1. Urinary symptoms Patient reports a 1 day h/o urinary urgency, urinary frequency and mild dysuria.  Denies hematuria, fevers, chills, abdominal pain, nausea, vomiting, back pain, vaginal discharge.  Patient has used nothing for symptoms.  Patient denies a h/o frequent or recurrent UTIs.     ROS: Per HPI  No Known Allergies Past Medical History:  Diagnosis Date  . Allergy   . History of migraine headaches   . Hyperlipidemia   . Neurocardiogenic syncope    H/O    Current Outpatient Medications:  .  fluticasone (FLONASE) 50 MCG/ACT nasal spray, Place 2 sprays into the nose daily as needed., Disp: , Rfl:  .  metoprolol succinate (TOPROL-XL) 25 MG 24 hr tablet, Take 1 tablet (25 mg total) by mouth daily., Disp: 90 tablet, Rfl: 3 .  pantoprazole (PROTONIX) 40 MG tablet, TAKE 1 TABLET (40 MG TOTAL) BY MOUTH DAILY AS NEEDED., Disp: 90 tablet, Rfl: 1  Assessment/ Plan: 53 y.o. female   1. Acute cystitis without hematuria Clinically consistent with an acute cystitis.  I sent an oral Keflex p.o. twice daily for the next 7 days.  I reviewed her last renal function panel which demonstrated normal renal function back in 2014.  I did advise her Pap would be a good idea to go ahead and schedule a full physical exam with repeat fasting labs so that we can update her record.  We discussed hydration and reasons for emergent evaluation in the emergency department.  She was good understanding will follow-up PRN - cephALEXin (KEFLEX) 500 MG capsule; Take 1 capsule (500 mg total) by mouth 2 (two) times daily for 7 days.  Dispense: 14 capsule; Refill: 0   Start time: 3:13pm End time: 3:18pm  Total time spent on  patient care (including telephone call/ virtual visit): 12 minutes  Josslynn Mentzer Hulen Skains, DO [facility] [phone]

## 2019-05-07 ENCOUNTER — Other Ambulatory Visit: Payer: Self-pay | Admitting: Cardiovascular Disease

## 2020-02-13 ENCOUNTER — Telehealth: Payer: BLUE CROSS/BLUE SHIELD | Admitting: Cardiovascular Disease

## 2020-02-23 ENCOUNTER — Other Ambulatory Visit: Payer: Self-pay | Admitting: Cardiovascular Disease

## 2020-03-03 ENCOUNTER — Telehealth: Payer: Self-pay | Admitting: *Deleted

## 2020-03-03 ENCOUNTER — Telehealth (INDEPENDENT_AMBULATORY_CARE_PROVIDER_SITE_OTHER): Payer: BC Managed Care – PPO | Admitting: Cardiovascular Disease

## 2020-03-03 ENCOUNTER — Encounter: Payer: Self-pay | Admitting: Cardiovascular Disease

## 2020-03-03 VITALS — Ht 70.0 in | Wt 160.0 lb

## 2020-03-03 DIAGNOSIS — R072 Precordial pain: Secondary | ICD-10-CM

## 2020-03-03 DIAGNOSIS — R55 Syncope and collapse: Secondary | ICD-10-CM

## 2020-03-03 DIAGNOSIS — E7800 Pure hypercholesterolemia, unspecified: Secondary | ICD-10-CM | POA: Diagnosis not present

## 2020-03-03 NOTE — Telephone Encounter (Signed)
  Patient Consent for Virtual Visit         Elizabeth Romero has provided verbal consent on 03/03/2020 for a virtual visit (video or telephone).   CONSENT FOR VIRTUAL VISIT FOR:  Elizabeth Romero  By participating in this virtual visit I agree to the following:  I hereby voluntarily request, consent and authorize [facility] and its employed or contracted physicians, physician assistants, nurse practitioners or other licensed health care professionals (the Practitioner), to provide me with telemedicine health care services (the "Services") as deemed necessary by the treating Practitioner. I acknowledge and consent to receive the Services by the Practitioner via telemedicine. I understand that the telemedicine visit will involve communicating with the Practitioner through live audiovisual communication technology and the disclosure of certain medical information by electronic transmission. I acknowledge that I have been given the opportunity to request an in-person assessment or other available alternative prior to the telemedicine visit and am voluntarily participating in the telemedicine visit.  I understand that I have the right to withhold or withdraw my consent to the use of telemedicine in the course of my care at any time, without affecting my right to future care or treatment, and that the Practitioner or I may terminate the telemedicine visit at any time. I understand that I have the right to inspect all information obtained and/or recorded in the course of the telemedicine visit and may receive copies of available information for a reasonable fee.  I understand that some of the potential risks of receiving the Services via telemedicine include:  Marland Kitchen Delay or interruption in medical evaluation due to technological equipment failure or disruption; . Information transmitted may not be sufficient (e.g. poor resolution of images) to allow for appropriate medical decision making by the Practitioner;  and/or  . In rare instances, security protocols could fail, causing a breach of personal health information.  Furthermore, I acknowledge that it is my responsibility to provide information about my medical history, conditions and care that is complete and accurate to the best of my ability. I acknowledge that Practitioner's advice, recommendations, and/or decision may be based on factors not within their control, such as incomplete or inaccurate data provided by me or distortions of diagnostic images or specimens that may result from electronic transmissions. I understand that the practice of medicine is not an exact science and that Practitioner makes no warranties or guarantees regarding treatment outcomes. I acknowledge that a copy of this consent can be made available to me via my patient portal [facility] MyChart), or I can request a printed copy by calling the office of [facility].    I understand that my insurance will be billed for this visit.   I have read or had this consent read to me. . I understand the contents of this consent, which adequately explains the benefits and risks of the Services being provided via telemedicine.  . I have been provided ample opportunity to ask questions regarding this consent and the Services and have had my questions answered to my satisfaction. . I give my informed consent for the services to be provided through the use of telemedicine in my medical care

## 2020-03-03 NOTE — Telephone Encounter (Signed)
The patient has been called about the virtual appointment today with Dr. Royann Shivers. Instructions provided. The AVS will be mailed. The patient verbalized their understanding.  EKG nurse visit appointment made for 03/08/20 at 8 am with lab work after.

## 2020-03-03 NOTE — Progress Notes (Signed)
Virtual Visit via Telephone Note   This visit type was conducted due to national recommendations for restrictions regarding the COVID-19 Pandemic (e.g. social distancing) in an effort to limit this patient's exposure and mitigate transmission in our community.  Due to her co-morbid illnesses, this patient is at least at moderate risk for complications without adequate follow up.  This format is felt to be most appropriate for this patient at this time.  The patient did not have access to video technology/had technical difficulties with video requiring transitioning to audio format only (telephone).  All issues noted in this document were discussed and addressed.  No physical exam could be performed with this format.  Please refer to the patient's chart for her  consent to telehealth for [facility].   The patient was identified using 2 identifiers.  Date:  03/03/2020   ID:  Elizabeth Romero, DOB April 21, 1966, MRN [id number]  Patient Location: Home Provider Location: Office  PCP:  Junie Spencer, FNP  Cardiologist:  Nagee Goates Electrophysiologist:  None   Evaluation Performed:  Follow-Up Visit  Chief Complaint: Palpitations  History of Present Illness:    Elizabeth Romero is a 54 y.o. female with a history of vasovagal syncope and mild hypercholesterolemia and palpitations.  Her father [name] is also my patient.  She has been under a lot of emotional stress, even though she is not working anymore.  She wakes up every morning at 4:30 to provide care for her grandson who is 63 months old.  Her mother-in-law and her mother both have age-related disabilities which cause her a lot of concern.  She is worried that her father is at risk of accidents since he continues to work so hard.  She inquired about the possibility of receiving anxiolytic medications.  She has occasional runs of tachycardia which are the typical prodrome before her syncopal events, but she has never reached the point  where she feels that she is about to lose consciousness.  She has not had full-blown syncope.  She denies angina or shortness of breath with activity, but 3 weeks ago had an episode of "heartburn" (burning sensation from her throat down to her epigastrium) that was constantly present for about a whole week and did not respond to any antacid medications.  It was associate with reduced appetite.  It did not appear to worsen with physical activity and did not limit her ability to work around the house.  She tried to see her primary care provider but the wait was 3 weeks and eventually the symptoms subsided spontaneously.  She is trying to establish follow-up with a new primary care doctor.  The patient does not have symptoms concerning for COVID-19 infection (fever, chills, cough, or new shortness of breath).    Past Medical History:  Diagnosis Date  . Allergy   . History of migraine headaches   . Hyperlipidemia   . Neurocardiogenic syncope    H/O   Past Surgical History:  Procedure Laterality Date  . ABDOMINAL HYSTERECTOMY  2012  . NM MYOCAR PERF WALL MOTION  04/10/2012   Low risk scan  . TILT TABLE STUDY  12/18/2006   Positive - documented sinus tach rates up to 162 and dropped abruptly to 45 w/BP drop to 60 w/brief episode of unconsciousness  . US ECHOCARDIOGRAPHY  04/27/2010   Normal EF >55%     Current Meds  Medication Sig  . fluticasone (FLONASE) 50 MCG/ACT nasal spray Place 2 sprays into the nose  daily as needed.  . metoprolol succinate (TOPROL-XL) 25 MG 24 hr tablet Take 1 tablet (25 mg total) by mouth daily. NEED OV.     Allergies:   Patient has no known allergies.   Social History   Tobacco Use  . Smoking status: Never Smoker  . Smokeless tobacco: Never Used  Substance Use Topics  . Alcohol use: No    Alcohol/week: 0.0 standard drinks  . Drug use: No     Family Hx: The patient's family history includes Hypertension in her father and sister.  ROS:   Please see the  history of present illness.     All other systems reviewed and are negative.  Prior CV studies:   The following studies were reviewed today:   Labs/Other Tests and Data Reviewed:    EKG:  The most recent ECG available is from April 2019  Recent Labs: No results found for requested labs within last 8760 hours.   Recent Lipid Panel Lab Results  Component Value Date/Time   CHOL 195 09/11/2013 08:52 AM   TRIG 82 09/11/2013 08:52 AM   HDL 46 09/11/2013 08:52 AM   CHOLHDL 4.2 09/11/2013 08:52 AM   LDLCALC 133 (H) 09/11/2013 08:52 AM    Wt Readings from Last 3 Encounters:  03/03/20 160 lb (72.6 kg)  02/11/19 161 lb (73 kg)  04/01/18 159 lb 12.8 oz (72.5 kg)     Objective:    Vital Signs:  Ht 5\' 10"  (1.778 m)   Wt 160 lb (72.6 kg)   BMI 22.96 kg/m    VITAL SIGNS:  reviewed Unable to examine  ASSESSMENT & PLAN:    1. Heartburn: She has virtually no coronary risk factors, other than her father's history of CAD and borderline LDL cholesterol.  She had a hysterectomy but only now appears to be going through perimenopausal symptoms.  She would therefore be at low risk for coronary disease, but the prolonged episode of chest discomfort last month remains unexplained.  Asked her to come in for an ECG and we will also check her fasting lipid profile at that time. 2. HLP: to get an updated lipid profile. 3. History of vasovagal syncope: No recent episodes.  COVID-19 Education: The signs and symptoms of COVID-19 were discussed with the patient and how to seek care for testing (follow up with PCP or arrange E-visit).  The importance of social distancing was discussed today.  Time:   Today, I have spent 18 minutes with the patient with telehealth technology discussing the above problems.     Medication Adjustments/Labs and Tests Ordered: Current medicines are reviewed at length with the patient today.  Concerns regarding medicines are outlined above.   Tests Ordered: No orders  of the defined types were placed in this encounter.   Medication Changes: No orders of the defined types were placed in this encounter.   Follow Up:  In Person 1 year  Signed, Sanda Klein, MD  03/03/2020 8:56 AM    [place]

## 2020-03-03 NOTE — Patient Instructions (Signed)
Medication Instructions:  No changes *If you need a refill on your cardiac medications before your next appointment, please call your pharmacy*   Lab Work: Your provider would like for you to return in few weeks  to have the following labs drawn: fasting lipid and CMET. You do not need an appointment for the lab. Once in our office lobby there is a podium where you can sign in and ring the doorbell to alert [place] that you are here. The lab is open from 8:00 am to 4:30 pm; closed for lunch from 12:45pm-1:45pm.  If you have labs (blood work) drawn today and your tests are completely normal, you will receive your results only by: Marland Kitchen MyChart Message (if you have MyChart) OR . A paper copy in the mail If you have any lab test that is abnormal or we need to change your treatment, we will call you to review the results.   Testing/Procedures: None ordered   Follow-Up: At [facility], you and your health needs are our priority.  As part of our continuing mission to provide you with exceptional heart care, we have created designated Provider Care Teams.  These Care Teams include your primary Cardiologist (physician) and Advanced Practice Providers (APPs -  Physician Assistants and Nurse Practitioners) who all work together to provide you with the care you need, when you need it.  We recommend signing up for the patient portal called "MyChart".  Sign up information is provided on this After Visit Summary.  MyChart is used to connect with patients for Virtual Visits (Telemedicine).  Patients are able to view lab/test results, encounter notes, upcoming appointments, etc.  Non-urgent messages can be sent to your provider as well.   To learn more about what you can do with MyChart, go to [email or web address].    Your next appointment:   12 month(s)  The format for your next appointment:   In Person  Provider:   You may see Thurmon Fair, MD or one of the following Advanced Practice Providers on  your designated Care Team:    Azalee Course, PA-C  Micah Flesher, [place] or   Judy Pimple, [place]    Other Instructions We wills et up the EKG on the same day you come for your labs. This will be by appointment.

## 2020-03-04 ENCOUNTER — Encounter: Payer: Self-pay | Admitting: Cardiovascular Disease

## 2020-03-08 ENCOUNTER — Ambulatory Visit (INDEPENDENT_AMBULATORY_CARE_PROVIDER_SITE_OTHER): Payer: BC Managed Care – PPO | Admitting: *Deleted

## 2020-03-08 ENCOUNTER — Other Ambulatory Visit: Payer: Self-pay

## 2020-03-08 VITALS — Ht 70.0 in | Wt 160.0 lb

## 2020-03-08 DIAGNOSIS — R072 Precordial pain: Secondary | ICD-10-CM

## 2020-03-08 LAB — LIPID PANEL
Chol/HDL Ratio: 3.7 ratio (ref 0.0–4.4)
Cholesterol, Total: 199 mg/dL (ref 100–199)
HDL: 54 mg/dL (ref >39)
LDL Chol Calc (NIH): 126 mg/dL — ABNORMAL HIGH (ref 0–99)
Triglycerides: 104 mg/dL (ref 0–149)
VLDL Cholesterol Cal: 19 mg/dL (ref 5–40)

## 2020-03-08 LAB — COMPREHENSIVE METABOLIC PANEL
ALT: 6 IU/L (ref 0–32)
AST: 10 IU/L (ref 0–40)
Albumin/Globulin Ratio: 1.6 (ref 1.2–2.2)
Albumin: 4.2 g/dL (ref 3.8–4.9)
Alkaline Phosphatase: 68 IU/L (ref 48–121)
BUN/Creatinine Ratio: 18 (ref 9–23)
BUN: 13 mg/dL (ref 6–24)
Bilirubin Total: 0.3 mg/dL (ref 0.0–1.2)
CO2: 24 mmol/L (ref 20–29)
Calcium: 9.2 mg/dL (ref 8.7–10.2)
Chloride: 104 mmol/L (ref 96–106)
Creatinine, Ser: 0.72 mg/dL (ref 0.57–1.00)
GFR calc Af Amer: 112 mL/min/{1.73_m2} (ref >59)
GFR calc non Af Amer: 97 mL/min/{1.73_m2} (ref >59)
Globulin, Total: 2.7 g/dL (ref 1.5–4.5)
Glucose: 80 mg/dL (ref 65–99)
Potassium: 5.1 mmol/L (ref 3.5–5.2)
Sodium: 137 mmol/L (ref 134–144)
Total Protein: 6.9 g/dL (ref 6.0–8.5)

## 2020-03-08 NOTE — Progress Notes (Signed)
The patient came in today for an EKG visit as a follow up from her virtual visit on 03/03/20. The patient had complaints of chest pain a week prior. She stated that the pain has stopped and she feels like it may have been from indigestion.   Per DOD, the EKG was normal.

## 2020-03-09 ENCOUNTER — Encounter: Payer: Self-pay | Admitting: *Deleted

## 2020-05-21 ENCOUNTER — Other Ambulatory Visit: Payer: Self-pay | Admitting: Cardiovascular Disease

## 2021-02-24 ENCOUNTER — Other Ambulatory Visit: Payer: Self-pay | Admitting: Cardiovascular Disease

## 2021-02-24 NOTE — Telephone Encounter (Signed)
Rx(s) sent to pharmacy electronically.  

## 2021-03-15 ENCOUNTER — Other Ambulatory Visit: Payer: Self-pay

## 2021-03-15 ENCOUNTER — Ambulatory Visit: Payer: BC Managed Care – PPO | Admitting: Cardiovascular Disease

## 2021-03-15 ENCOUNTER — Encounter: Payer: Self-pay | Admitting: Cardiovascular Disease

## 2021-03-15 VITALS — BP 115/61 | HR 55 | Ht 70.0 in | Wt 150.8 lb

## 2021-03-15 DIAGNOSIS — R002 Palpitations: Secondary | ICD-10-CM

## 2021-03-15 DIAGNOSIS — E7800 Pure hypercholesterolemia, unspecified: Secondary | ICD-10-CM | POA: Diagnosis not present

## 2021-03-15 DIAGNOSIS — R55 Syncope and collapse: Secondary | ICD-10-CM | POA: Diagnosis not present

## 2021-03-15 MED ORDER — METOPROLOL SUCCINATE ER 25 MG PO TB24: 1.0000 | ORAL_TABLET | Freq: Every day | ORAL | 3 refills | Status: DC

## 2021-03-15 NOTE — Progress Notes (Signed)
Cardiology Office Note    ID:  Elizabeth Romero, DOB 1966-06-30, MRN [id number]  PCP:  Pcp, No  Cardiologist:  Clytie Shetley Electrophysiologist:  None   Evaluation Performed:  Follow-Up Visit  Chief Complaint: Palpitations  History of Present Illness:    Elizabeth Romero is a 55 y.o. female with a history of vasovagal syncope and mild hypercholesterolemia and palpitations.  Her father [name] is also my patient.  She has had a good year without syncope or other major events.  She had 1 strong "hard" heartbeat that occurred out of the blue while she was playing with her grandson and "did not feel right" for a few seconds afterwards, but this occurred in February and has not happened since.  For the most part she does not have palpitations that she denies chest pain, shortness of breath, fatigue, syncope, dizziness, edema, orthopnea, PND or claudication.  She is taking care of her 71-year-old and 16-month-old grandchildren.  They keep her very busy.  Her grandson enjoys playing with the animals [place] the farm and riding the tractor.  In the past, her episodes of syncope were often preceded by a racing heartbeat.  This has not occurred in the last year or 2.   Past Medical History:  Diagnosis Date  . Allergy   . History of migraine headaches   . Hyperlipidemia   . Neurocardiogenic syncope    H/O   Past Surgical History:  Procedure Laterality Date  . ABDOMINAL HYSTERECTOMY  2012  . NM MYOCAR PERF WALL MOTION  04/10/2012   Low risk scan  . TILT TABLE STUDY  12/18/2006   Positive - documented sinus tach rates up to 162 and dropped abruptly to 45 w/BP drop to 60 w/brief episode of unconsciousness  . US ECHOCARDIOGRAPHY  04/27/2010   Normal EF >55%     Current Meds  Medication Sig  . fluticasone (FLONASE) 50 MCG/ACT nasal spray Place 2 sprays into the nose daily as needed.  . [DISCONTINUED] metoprolol succinate (TOPROL-XL) 25 MG 24 hr tablet TAKE 1 TABLET BY MOUTH ONCE DAILY.      Allergies:   Patient has no known allergies.   Social History   Tobacco Use  . Smoking status: Never Smoker  . Smokeless tobacco: Never Used  Vaping Use  . Vaping Use: Never used  Substance Use Topics  . Alcohol use: No    Alcohol/week: 0.0 standard drinks  . Drug use: No     Family Hx: The patient's family history includes Hypertension in her father and sister.  ROS:   Please see the history of present illness.     All other systems reviewed and are negative.  Prior CV studies:   The following studies were reviewed today:   Labs/Other Tests and Data Reviewed:    EKG: ECG is ordered today and shows sinus bradycardia, otherwise completely normal.  Recent Labs: No results found for requested labs within last 8760 hours.   Recent Lipid Panel Lab Results  Component Value Date/Time   CHOL 199 03/08/2020 08:09 AM   TRIG 104 03/08/2020 08:09 AM   HDL 54 03/08/2020 08:09 AM   CHOLHDL 3.7 03/08/2020 08:09 AM   CHOLHDL 4.2 09/11/2013 08:52 AM   LDLCALC 126 (H) 03/08/2020 08:09 AM    Wt Readings from Last 3 Encounters:  03/15/21 150 lb 12.8 oz (68.4 kg)  03/09/20 160 lb (72.6 kg)  03/03/20 160 lb (72.6 kg)     Objective:    Vital Signs:  BP 115/61   Pulse (!) 55   Ht 5\' 10"  (1.778 m)   Wt 150 lb 12.8 oz (68.4 kg)   SpO2 98%   BMI 21.64 kg/m      General: Alert, oriented x3, no distress, appears lean and fit Head: no evidence of trauma, PERRL, EOMI, no exophtalmos or lid lag, no myxedema, no xanthelasma; normal ears, nose and oropharynx Neck: normal jugular venous pulsations and no hepatojugular reflux; brisk carotid pulses without delay and no carotid bruits Chest: clear to auscultation, no signs of consolidation by percussion or palpation, normal fremitus, symmetrical and full respiratory excursions Cardiovascular: normal position and quality of the apical impulse, regular rhythm, normal first and second heart sounds, no murmurs, rubs or gallops Abdomen:  no tenderness or distention, no masses by palpation, no abnormal pulsatility or arterial bruits, normal bowel sounds, no hepatosplenomegaly Extremities: no clubbing, cyanosis or edema; 2+ radial, ulnar and brachial pulses bilaterally; 2+ right femoral, posterior tibial and dorsalis pedis pulses; 2+ left femoral, posterior tibial and dorsalis pedis pulses; no subclavian or femoral bruits Neurological: grossly nonfocal Psych: Normal mood and affect   ASSESSMENT & PLAN:    1. Palpitations: These are very infrequent and do not sound particularly concerning.  Suggested a Kardia mobile or smart watch for monitoring.   2. HLP: Borderline elevated total cholesterol and LDL cholesterol, otherwise excellent lipid profile.  Avoid saturated fat, increase intake of unsaturated fat from fish/nuts/oils.  Avoid excessive carbohydrates. 3. History of vasovagal syncope: Has not had one in years.  Patient Instructions  Medication Instructions:  No changes *If you need a refill on your cardiac medications before your next appointment, please call your pharmacy*   Lab Work: None ordered If you have labs (blood work) drawn today and your tests are completely normal, you will receive your results only by: MyChart Message (if you have MyChart) OR . A paper copy in the mail If you have any lab test that is abnormal or we need to change your treatment, we will call you to review the results.   Testing/Procedures: None ordered   Follow-Up: At [facility], you and your health needs are our priority.  As part of our continuing mission to provide you with exceptional heart care, we have created designated Provider Care Teams.  These Care Teams include your primary Cardiologist (physician) and Advanced Practice Providers (APPs -  Physician Assistants and Nurse Practitioners) who all work together to provide you with the care you need, when you need it.  We recommend signing up for the patient portal called  "MyChart".  Sign up information is provided on this After Visit Summary.  MyChart is used to connect with patients for Virtual Visits (Telemedicine).  Patients are able to view lab/test results, encounter notes, upcoming appointments, etc.  Non-urgent messages can be sent to your provider as well.   To learn more about what you can do with MyChart, go to [facility].    Your next appointment:   12 month(s)  The format for your next appointment:   In Person  Provider:   You may see [email or web address], MD or one of the following Advanced Practice Providers on your designated Care Team:    Thurmon Fair, PA-C  Azalee Course, Micah Flesher or   [place], Judy Pimple        Medication Changes: Meds ordered this encounter  Medications  . metoprolol succinate (TOPROL-XL) 25 MG 24 hr tablet    Sig: Take 1 tablet (25 mg  total) by mouth daily.    Dispense:  90 tablet    Refill:  3    Follow Up:  In Person 1 year  Signed, Thurmon Fair, MD  03/15/2021 10:04 AM    [facility]

## 2021-03-15 NOTE — Patient Instructions (Signed)

## 2022-03-17 ENCOUNTER — Ambulatory Visit: Payer: BC Managed Care – PPO | Admitting: Cardiovascular Disease

## 2022-03-17 ENCOUNTER — Encounter: Payer: Self-pay | Admitting: Cardiovascular Disease

## 2022-03-17 VITALS — BP 114/70 | HR 58 | Ht 70.0 in | Wt 148.2 lb

## 2022-03-17 DIAGNOSIS — R002 Palpitations: Secondary | ICD-10-CM | POA: Diagnosis not present

## 2022-03-17 DIAGNOSIS — R55 Syncope and collapse: Secondary | ICD-10-CM | POA: Diagnosis not present

## 2022-03-17 NOTE — Patient Instructions (Addendum)
Medication Instructions:  No changes  *If you need a refill on your cardiac medications before your next appointment, please call your pharmacy*   Lab Work: Not needed If you have labs (blood work) drawn today and your tests are completely normal, you will receive your results only by: MyChart Message (if you have MyChart) OR A paper copy in the mail If you have any lab test that is abnormal or we need to change your treatment, we will call you to review the results.   Testing/Procedures: Not needed   Follow-Up: At [facility], you and your health needs are our priority.  As part of our continuing mission to provide you with exceptional heart care, we have created designated Provider Care Teams.  These Care Teams include your primary Cardiologist (physician) and Advanced Practice Providers (APPs -  Physician Assistants and Nurse Practitioners) who all work together to provide you with the care you need, when you need it.     Your next appointment:   12 month(s)  The format for your next appointment:   In Person  Provider:   Thurmon Fair, MD

## 2022-03-17 NOTE — Progress Notes (Signed)
Cardiology Office Note    ID:  Elizabeth Romero, DOB Jun 28, 1966, MRN [id number]  PCP:  Pcp, No  Cardiologist:  Strummer Canipe Electrophysiologist:  None   Evaluation Performed:  Follow-Up Visit  Chief Complaint: History of syncope  History of Present Illness:    Elizabeth Romero is a 56 y.o. female with a history of vasovagal syncope and mild hypercholesterolemia and palpitations.  Her father Elizabeth Romero is also my patient.  Her own health has been good over the last year.  She is rarely troubled by palpitations.  She has had rare episodes of lightheadedness which she can promptly improved by sitting down or lying down.  She is under emotional stress due to her her dad's health problems (he has had multiple interventions from his defibrillator) and even more so due to her mom's rapidly progressive dementia.  She is also quite busy taking care of her 65-year-old and 13-year-old grandchildren.  She has not had full-blown syncope in the last 2 or 3 years.  She denies chest pain or shortness of breath either at rest or with activity.  She has not had any falls or injuries.  She does not have lower extremity edema, claudication or focal neurological complaints, orthopnea or PND.  She has lost additional weight (20 pounds in the last 2 years) and is now borderline underweight with a BMI of 21.   Past Medical History:  Diagnosis Date   Allergy    History of migraine headaches    Hyperlipidemia    Neurocardiogenic syncope    H/O   Past Surgical History:  Procedure Laterality Date   ABDOMINAL HYSTERECTOMY  2012   NM MYOCAR PERF WALL MOTION  04/10/2012   Low risk scan   TILT TABLE STUDY  12/18/2006   Positive - documented sinus tach rates up to 162 and dropped abruptly to 45 w/BP drop to 60 w/brief episode of unconsciousness   US ECHOCARDIOGRAPHY  04/27/2010   Normal EF >55%     Current Meds  Medication Sig   Fiber Adult Gummies 2 g CHEW Chew by mouth daily in the afternoon.   metoprolol  succinate (TOPROL-XL) 25 MG 24 hr tablet Take 1 tablet (25 mg total) by mouth daily.   Omega-3 Fatty Acids (FISH OIL) 1000 MG CAPS Take 1,000 mg by mouth daily in the afternoon.     Allergies:   Patient has no known allergies.   Social History   Tobacco Use   Smoking status: Never   Smokeless tobacco: Never  Vaping Use   Vaping Use: Never used  Substance Use Topics   Alcohol use: No    Alcohol/week: 0.0 standard drinks   Drug use: No     Family Hx: The patient's family history includes Hypertension in her father and sister.  ROS:   Please see the history of present illness.     All other systems reviewed and are negative.  Prior CV studies:   The following studies were reviewed today:   Labs/Other Tests and Data Reviewed:    EKG: ECG is ordered today and shows sinus bradycardia with nonspecific ST segment changes in leads II and aVF with positive T waves, QTc 426 ms  Recent Labs: No results found for requested labs within last 8760 hours.   Recent Lipid Panel Lab Results  Component Value Date/Time   CHOL 199 03/08/2020 08:09 AM   TRIG 104 03/08/2020 08:09 AM   HDL 54 03/08/2020 08:09 AM   CHOLHDL 3.7 03/08/2020 08:09  AM   CHOLHDL 4.2 09/11/2013 08:52 AM   LDLCALC 126 (H) 03/08/2020 08:09 AM    Wt Readings from Last 3 Encounters:  03/17/22 148 lb 3.2 oz (67.2 kg)  03/15/21 150 lb 12.8 oz (68.4 kg)  03/09/20 160 lb (72.6 kg)     Objective:    Vital Signs:  BP 114/70 (BP Location: Left Arm, Patient Position: Sitting, Cuff Size: Normal)   Pulse (!) 58   Ht 5\' 10"  (1.778 m)   Wt 148 lb 3.2 oz (67.2 kg)   SpO2 97%   BMI 21.26 kg/m     General: Alert, oriented x3, no distress, appears very lean and fit Head: no evidence of trauma, PERRL, EOMI, no exophtalmos or lid lag, no myxedema, no xanthelasma; normal ears, nose and oropharynx Neck: normal jugular venous pulsations and no hepatojugular reflux; brisk carotid pulses without delay and no carotid  bruits Chest: clear to auscultation, no signs of consolidation by percussion or palpation, normal fremitus, symmetrical and full respiratory excursions Cardiovascular: normal position and quality of the apical impulse, regular rhythm, normal first and second heart sounds, no murmurs, rubs or gallops Abdomen: no tenderness or distention, no masses by palpation, no abnormal pulsatility or arterial bruits, normal bowel sounds, no hepatosplenomegaly Extremities: no clubbing, cyanosis or edema; 2+ radial, ulnar and brachial pulses bilaterally; 2+ right femoral, posterior tibial and dorsalis pedis pulses; 2+ left femoral, posterior tibial and dorsalis pedis pulses; no subclavian or femoral bruits Neurological: grossly nonfocal Psych: Normal mood and affect    ASSESSMENT & PLAN:    Palpitations: Infrequent and not particularly bothersome.  No specific therapy recommended other than the current dose of metoprolol. History of vasovagal syncope: Avoid additional weight loss.  She should eat a diet with liberal amounts of sodium and stay very well-hydrated at all times.  Advised to promptly recognize prodrome of syncope and immediately lay horizontally to avoid injury. Borderline elevated total cholesterol and LDL.  No specific therapy needed in the absence of known CAD or PAD.  Have discussed diet.  Patient Instructions  Medication Instructions:  No changes  *If you need a refill on your cardiac medications before your next appointment, please call your pharmacy*   Lab Work: Not needed If you have labs (blood work) drawn today and your tests are completely normal, you will receive your results only by: [place] (if you have MyChart) OR A paper copy in the mail If you have any lab test that is abnormal or we need to change your treatment, we will call you to review the results.   Testing/Procedures: Not needed   Follow-Up: At [facility], you and your health needs are our priority.   As part of our continuing mission to provide you with exceptional heart care, we have created designated Provider Care Teams.  These Care Teams include your primary Cardiologist (physician) and Advanced Practice Providers (APPs -  Physician Assistants and Nurse Practitioners) who all work together to provide you with the care you need, when you need it.     Your next appointment:   12 month(s)  The format for your next appointment:   In Person  Provider:   Sanda Klein, MD         Medication Changes: No orders of the defined types were placed in this encounter.   Follow Up:  In Person  1 year  Signed, Sanda Klein, MD  03/17/2022 12:10 PM    [place]

## 2022-05-24 ENCOUNTER — Other Ambulatory Visit: Payer: Self-pay | Admitting: Cardiovascular Disease

## 2022-05-24 NOTE — Telephone Encounter (Signed)
Rx(s) sent to pharmacy electronically.  

## 2023-03-08 ENCOUNTER — Other Ambulatory Visit: Payer: Self-pay | Admitting: Cardiovascular Disease

## 2023-07-23 NOTE — Progress Notes (Unsigned)
Cardiology Office Note    ID:  Elizabeth Romero, DOB 08-14-1966, MRN [id number]  PCP:  Pcp, No  Cardiologist:  Deantae Shackleton Electrophysiologist:  None   Evaluation Performed:  Follow-Up Visit  Chief Complaint: History of syncope  History of Present Illness:    Elizabeth Romero is a 57 y.o. female with a history of vasovagal syncope and mild hypercholesterolemia and palpitations.  Her father [name] was also my patient and passed away about 6 months ago.  Elizabeth Romero still grieving her father's passing and is having to deal with her mother's progressive dementia as well as taking care of her grandchildren during the day.  She has not had a lot of time to take care of herself.  She has not had any syncopal events or even near syncope.  Her weight loss has stabilized and she has gained back a little bit of weight as her BMI is now 22.  The patient specifically denies any chest pain at rest exertion, dyspnea at rest or with exertion, orthopnea, paroxysmal nocturnal dyspnea, syncope, palpitations, focal neurological deficits, intermittent claudication, lower extremity edema, unexplained weight gain, cough, hemoptysis or wheezing.  Her most recent lipid profile showed an LDL cholesterol of 133, HDL 59, normal triglycerides at 108.  Past Medical History:  Diagnosis Date   Allergy    History of migraine headaches    Hyperlipidemia    Neurocardiogenic syncope    H/O   Past Surgical History:  Procedure Laterality Date   ABDOMINAL HYSTERECTOMY  2012   NM MYOCAR PERF WALL MOTION  04/10/2012   Low risk scan   TILT TABLE STUDY  12/18/2006   Positive - documented sinus tach rates up to 162 and dropped abruptly to 45 w/BP drop to 60 w/brief episode of unconsciousness   US ECHOCARDIOGRAPHY  04/27/2010   Normal EF >55%     Current Meds  Medication Sig   ALPRAZolam (XANAX) 0.5 MG tablet Take 0.5 mg by mouth as needed for anxiety.   Fiber Adult Gummies 2 g CHEW Chew by mouth daily in the afternoon.    fluticasone (FLONASE) 50 MCG/ACT nasal spray Place 2 sprays into the nose daily as needed.   metoprolol succinate (TOPROL-XL) 25 MG 24 hr tablet TAKE ONE TABLET BY MOUTH DAILY     Allergies:   Patient has no known allergies.   Social History   Tobacco Use   Smoking status: Never   Smokeless tobacco: Never  Vaping Use   Vaping status: Never Used  Substance Use Topics   Alcohol use: No    Alcohol/week: 0.0 standard drinks of alcohol   Drug use: No     Family Hx: The patient's family history includes Hypertension in her father and sister.  ROS:   Please see the history of present illness.     All other systems reviewed and are negative.  Studies Reviewed: Marland Kitchen   EKG Interpretation Date/Time:  Tuesday July 24 2023 09:40:33 EDT Ventricular Rate:  62 PR Interval:  120 QRS Duration:  78 QT Interval:  444 QTC Calculation: 450 R Axis:   72  Text Interpretation: Normal sinus rhythm Nonspecific ST abnormality No previous ECGs available Confirmed by Shamaya Kauer [phone]) on 07/24/2023 10:03:49 AM   Risk Assessment/Calculations:      Labs/Other Tests and Data Reviewed:    Recent Labs: No results found for requested labs within last 365 days.   01/09/2022 Cholesterol 211, triglycerides 108, HDL 59, LDL 133 06/20/2022 Potassium 3.7, creatinine 0.73, normal  liver function tests, TSH 2.210 07/04/2022 Hemoglobin 14.2  Recent Lipid Panel Lab Results  Component Value Date/Time   CHOL 199 03/08/2020 08:09 AM   TRIG 104 03/08/2020 08:09 AM   HDL 54 03/08/2020 08:09 AM   CHOLHDL 3.7 03/08/2020 08:09 AM   CHOLHDL 4.2 09/11/2013 08:52 AM   LDLCALC 126 (H) 03/08/2020 08:09 AM    Wt Readings from Last 3 Encounters:  07/24/23 156 lb 3.2 oz (70.9 kg)  03/17/22 148 lb 3.2 oz (67.2 kg)  03/15/21 150 lb 12.8 oz (68.4 kg)     Objective:    Vital Signs:  BP 110/68 (BP Location: Left Arm, Patient Position: Sitting, Cuff Size: Normal)   Pulse 67   Ht 5\' 10"  (1.778 m)   Wt  156 lb 3.2 oz (70.9 kg)   SpO2 96%   BMI 22.41 kg/m     General: Alert, oriented x3, no distress, appears very lean and fit Head: no evidence of trauma, PERRL, EOMI, no exophtalmos or lid lag, no myxedema, no xanthelasma; normal ears, nose and oropharynx Neck: normal jugular venous pulsations and no hepatojugular reflux; brisk carotid pulses without delay and no carotid bruits Chest: clear to auscultation, no signs of consolidation by percussion or palpation, normal fremitus, symmetrical and full respiratory excursions Cardiovascular: normal position and quality of the apical impulse, regular rhythm, normal first and second heart sounds, no murmurs, rubs or gallops Abdomen: no tenderness or distention, no masses by palpation, no abnormal pulsatility or arterial bruits, normal bowel sounds, no hepatosplenomegaly Extremities: no clubbing, cyanosis or edema; 2+ radial, ulnar and brachial pulses bilaterally; 2+ right femoral, posterior tibial and dorsalis pedis pulses; 2+ left femoral, posterior tibial and dorsalis pedis pulses; no subclavian or femoral bruits Neurological: grossly nonfocal Psych: Normal mood and affect    ASSESSMENT & PLAN:    Palpitations: Reasonably well-controlled with a very low-dose of metoprolol succinate. History of vasovagal syncope: No recent events.  Reminded her to stay well-hydrated, eat a liberal amount of sodium in her diet, avoid identifiable triggers, lay down horizontally if she has prodromal symptoms  HLP: Borderline elevated total cholesterol and LDL.  She has no clinical indication of CAD or PAD but is of course worried due to her family history.  Will schedule for coronary calcium score.  Patient Instructions  Medication Instructions:  No changes *If you need a refill on your cardiac medications before your next appointment, please call your pharmacy*  Testing/Procedures: Dr Royann Shivers has ordered a CT coronary calcium score.   Test locations:   [facility]  [facility] at [facility]  This is $99 out of pocket.   Coronary CalciumScan A coronary calcium scan is an imaging test used to look for deposits of calcium and other fatty materials (plaques) in the inner lining of the blood vessels of the heart (coronary arteries). These deposits of calcium and plaques can partly clog and narrow the coronary arteries without producing any symptoms or warning signs. This puts a person at risk for a heart attack. This test can detect these deposits before symptoms develop. Tell a health care provider about: Any allergies you have. All medicines you are taking, including vitamins, herbs, eye drops, creams, and over-the-counter medicines. Any problems you or family members have had with anesthetic medicines. Any blood disorders you have. Any surgeries you have had. Any medical conditions you have. Whether you are pregnant or may be pregnant. What are the risks? Generally,  this is a safe procedure. However, problems may occur, including: Harm to a pregnant woman and her unborn baby. This test involves the use of radiation. Radiation exposure can be dangerous to a pregnant woman and her unborn baby. If you are pregnant, you generally should not have this procedure done. Slight increase in the risk of cancer. This is because of the radiation involved in the test. What happens before the procedure? No preparation is needed for this procedure. What happens during the procedure? You will undress and remove any jewelry around your neck or chest. You will put on a hospital gown. Sticky electrodes will be placed on your chest. The electrodes will be connected to an electrocardiogram (ECG) machine to record a tracing of the electrical activity of your heart. A CT scanner will take pictures of your heart. During this time, you will be asked to lie still and hold your breath for  2-3 seconds while a picture of your heart is being taken. The procedure may vary among health care providers and hospitals. What happens after the procedure? You can get dressed. You can return to your normal activities. It is up to you to get the results of your test. Ask your health care provider, or the department that is doing the test, when your results will be ready. Summary A coronary calcium scan is an imaging test used to look for deposits of calcium and other fatty materials (plaques) in the inner lining of the blood vessels of the heart (coronary arteries). Generally, this is a safe procedure. Tell your health care provider if you are pregnant or may be pregnant. No preparation is needed for this procedure. A CT scanner will take pictures of your heart. You can return to your normal activities after the scan is done. This information is not intended to replace advice given to you by your health care provider. Make sure you discuss any questions you have with your health care provider. Document Released: 04/06/2008 Document Revised: 08/28/2016 Document Reviewed: 08/28/2016 Elsevier Interactive Patient Education  2017 [employer].    Follow-Up: At [facility], you and your health needs are our priority.  As part of our continuing mission to provide you with exceptional heart care, we have created designated Provider Care Teams.  These Care Teams include your primary Cardiologist (physician) and Advanced Practice Providers (APPs -  Physician Assistants and Nurse Practitioners) who all work together to provide you with the care you need, when you need it.  We recommend signing up for the patient portal called "MyChart".  Sign up information is provided on this After Visit Summary.  MyChart is used to connect with patients for Virtual Visits (Telemedicine).  Patients are able to view lab/test results, encounter notes, upcoming appointments, etc.  Non-urgent messages can be sent  to your provider as well.   To learn more about what you can do with MyChart, go to [email or web address].    Your next appointment:   1 year(s)  Provider:   Thurmon Fair, MD        Medication Changes: No orders of the defined types were placed in this encounter.   Follow Up:  In Person  1 year  Signed, Thurmon Fair, MD  07/26/2023 3:09 PM

## 2023-07-24 ENCOUNTER — Encounter: Payer: Self-pay | Admitting: Cardiovascular Disease

## 2023-07-24 ENCOUNTER — Ambulatory Visit: Payer: BC Managed Care – PPO | Attending: Cardiovascular Disease | Admitting: Cardiovascular Disease

## 2023-07-24 VITALS — BP 110/68 | HR 67 | Ht 70.0 in | Wt 156.2 lb

## 2023-07-24 DIAGNOSIS — E7800 Pure hypercholesterolemia, unspecified: Secondary | ICD-10-CM

## 2023-07-24 DIAGNOSIS — R55 Syncope and collapse: Secondary | ICD-10-CM | POA: Diagnosis not present

## 2023-07-24 DIAGNOSIS — R002 Palpitations: Secondary | ICD-10-CM | POA: Diagnosis not present

## 2023-07-24 NOTE — Patient Instructions (Signed)
Medication Instructions:  No changes *If you need a refill on your cardiac medications before your next appointment, please call your pharmacy*  Testing/Procedures: Dr Royann Shivers has ordered a CT coronary calcium score.   Test locations:  [facility]  [place]  [facility] [place]  This is $99 out of pocket.   Coronary CalciumScan A coronary calcium scan is an imaging test used to look for deposits of calcium and other fatty materials (plaques) in the inner lining of the blood vessels of the heart (coronary arteries). These deposits of calcium and plaques can partly clog and narrow the coronary arteries without producing any symptoms or warning signs. This puts a person at risk for a heart attack. This test can detect these deposits before symptoms develop. Tell a health care provider about: Any allergies you have. All medicines you are taking, including vitamins, herbs, eye drops, creams, and over-the-counter medicines. Any problems you or family members have had with anesthetic medicines. Any blood disorders you have. Any surgeries you have had. Any medical conditions you have. Whether you are pregnant or may be pregnant. What are the risks? Generally, this is a safe procedure. However, problems may occur, including: Harm to a pregnant woman and her unborn baby. This test involves the use of radiation. Radiation exposure can be dangerous to a pregnant woman and her unborn baby. If you are pregnant, you generally should not have this procedure done. Slight increase in the risk of cancer. This is because of the radiation involved in the test. What happens before the procedure? No preparation is needed for this procedure. What happens during the procedure? You will undress and remove any jewelry around your neck or chest. You will put on a hospital gown. Sticky electrodes will be placed on your chest. The  electrodes will be connected to an electrocardiogram (ECG) machine to record a tracing of the electrical activity of your heart. A CT scanner will take pictures of your heart. During this time, you will be asked to lie still and hold your breath for 2-3 seconds while a picture of your heart is being taken. The procedure may vary among health care providers and hospitals. What happens after the procedure? You can get dressed. You can return to your normal activities. It is up to you to get the results of your test. Ask your health care provider, or the department that is doing the test, when your results will be ready. Summary A coronary calcium scan is an imaging test used to look for deposits of calcium and other fatty materials (plaques) in the inner lining of the blood vessels of the heart (coronary arteries). Generally, this is a safe procedure. Tell your health care provider if you are pregnant or may be pregnant. No preparation is needed for this procedure. A CT scanner will take pictures of your heart. You can return to your normal activities after the scan is done. This information is not intended to replace advice given to you by your health care provider. Make sure you discuss any questions you have with your health care provider. Document Released: 04/06/2008 Document Revised: 08/28/2016 Document Reviewed: 08/28/2016 Elsevier Interactive Patient Education  2017 [employer].    Follow-Up: At [facility], you and your health needs are our priority.  As part of our continuing mission to provide you with exceptional heart care, we have created designated Provider Care Teams.  These Care Teams include your primary Cardiologist (  physician) and Advanced Practice Providers (APPs -  Physician Assistants and Nurse Practitioners) who all work together to provide you with the care you need, when you need it.  We recommend signing up for the patient portal called "MyChart".  Sign up  information is provided on this After Visit Summary.  MyChart is used to connect with patients for Virtual Visits (Telemedicine).  Patients are able to view lab/test results, encounter notes, upcoming appointments, etc.  Non-urgent messages can be sent to your provider as well.   To learn more about what you can do with MyChart, go to [email or web address].    Your next appointment:   1 year(s)  Provider:   Thurmon Fair, MD

## 2023-08-27 ENCOUNTER — Ambulatory Visit (HOSPITAL_BASED_OUTPATIENT_CLINIC_OR_DEPARTMENT_OTHER)
Admission: RE | Admit: 2023-08-27 | Discharge: 2023-08-27 | Disposition: A | Discharge status: Home / Self Care | Payer: BC Managed Care – PPO | Source: Ambulatory Visit | Attending: Cardiovascular Disease | Admitting: Cardiovascular Disease

## 2023-08-27 DIAGNOSIS — E7800 Pure hypercholesterolemia, unspecified: Secondary | ICD-10-CM | POA: Insufficient documentation

## 2023-12-13 ENCOUNTER — Other Ambulatory Visit: Payer: Self-pay | Admitting: Cardiovascular Disease

## 2024-09-19 ENCOUNTER — Other Ambulatory Visit: Payer: Self-pay | Admitting: Cardiovascular Disease

## 2024-11-03 ENCOUNTER — Other Ambulatory Visit: Payer: Self-pay | Admitting: Cardiovascular Disease

## 2024-11-28 ENCOUNTER — Encounter: Payer: Self-pay | Admitting: Emergency Medicine

## 2024-11-28 ENCOUNTER — Ambulatory Visit: Admitting: Emergency Medicine

## 2024-11-28 VITALS — BP 100/70 | HR 73 | Ht 69.0 in | Wt 170.0 lb

## 2024-11-28 DIAGNOSIS — R072 Precordial pain: Secondary | ICD-10-CM

## 2024-11-28 DIAGNOSIS — R079 Chest pain, unspecified: Secondary | ICD-10-CM

## 2024-11-28 DIAGNOSIS — R55 Syncope and collapse: Secondary | ICD-10-CM

## 2024-11-28 DIAGNOSIS — Z79899 Other long term (current) drug therapy: Secondary | ICD-10-CM

## 2024-11-28 DIAGNOSIS — R002 Palpitations: Secondary | ICD-10-CM

## 2024-11-28 NOTE — Patient Instructions (Addendum)
 Medication Instructions:  TAKE YOUR METOPROLOL  SUCCINATE 25 MG (1 TABLET) 2 HOURS PRIOR TO YOUR CORONARY CTA SCAN.  Lab Work: NUTRITIONAL THERAPIST AND CBC TO BE DONE TODAY.  Testing/Procedures:   Your cardiac CT will be scheduled at one of the below locations:   [facility] [address] [place], [place] [address] [phone] (Severe contrast allergies only)  OR   [facility] [address] [place], [place] [address] [phone]  OR   [facility] [address] [place], [place] [address] [phone]  OR   [facility] [address]  [place], [place] [address]  OR   [facility] [address] [place], [place] [phone]  If scheduled at [facility], please arrive at the [facility] and Children's Entrance (Entrance C2) of [facility] 30 minutes prior to test start time. You can use the FREE valet parking offered at entrance C (encouraged to control the heart rate for the test)  Proceed to the [facility] Radiology Department (first floor) to check-in and test prep.  All radiology patients and guests should use entrance C2 at [facility], accessed from [facility], even though the hospital's physical address listed is [address].  If scheduled at the Heart and Vascular Tower at [address], please enter the parking lot using the [address] entrance and use the FREE valet service at the patient drop-off area. Enter the building and check-in with registration on the main floor.  If scheduled at [facility], please arrive to the Heart and Vascular Center 15 mins early for check-in and test prep.  There is spacious parking and easy access to the radiology department from the [facility] Heart and Vascular entrance. Please enter here and check-in with the desk attendant.   If scheduled at [facility], please arrive 30  minutes early for check-in and test prep.  Please follow these instructions carefully (unless otherwise directed):  An IV will be required for this test and Nitroglycerin will be given.   On the Night Before the Test: Be sure to Drink plenty of water. Do not consume any caffeinated/decaffeinated beverages or chocolate 12 hours prior to your test. Do not take any antihistamines 12 hours prior to your test.  On the Day of the Test: Drink plenty of water until 1 hour prior to the test. Do not eat any food 1 hour prior to test. You may take your regular medications prior to the test.  Take your METOPROLOL  SUCCINATE (LOPRESSOR ) 25 MG (1 TABLET)  two hours prior to test. Patients who wear a continuous glucose monitor MUST remove the device prior to scanning. FEMALES- please wear underwire-free bra if available, avoid dresses & tight clothing      After the Test: Drink plenty of water. After receiving IV contrast, you may experience a mild flushed feeling. This is normal. On occasion, you may experience a mild rash up to 24 hours after the test. This is not dangerous. If this occurs, you can take Benadryl 25 mg, Zyrtec, Claritin, or Allegra and increase your fluid intake. (Patients taking Tikosyn should avoid Benadryl, and may take Zyrtec, Claritin, or Allegra) If you experience trouble breathing, this can be serious. If it is severe call 911 IMMEDIATELY. If it is mild, please call our office.  We will call to schedule your test 2-4 weeks out understanding that some insurance companies will need an authorization  prior to the service being performed.   For more information and frequently asked questions, please visit our website : [email or web address]  For non-scheduling related questions, please contact the cardiac imaging nurse navigator should you have any questions/concerns: Cardiac Imaging Nurse Navigators Direct Office Dial: [phone]   For scheduling needs, including  cancellations and rescheduling, please call Brittany, [phone].  For billing questions, please call [phone].    Follow-Up: At [facility], you and your health needs are our priority.  As part of our continuing mission to provide you with exceptional heart care, our providers are all part of one team.  This team includes your primary Cardiologist (physician) and Advanced Practice Providers or APPs (Physician Assistants and Nurse Practitioners) who all work together to provide you with the care you need, when you need it.  Your next appointment:   1 YEAR  Provider:   Jerel Balding, MD OR Lum Louis, NP   We recommend signing up for the patient portal called MyChart.  Sign up information is provided on this After Visit Summary.  MyChart is used to connect with patients for Virtual Visits (Telemedicine).  Patients are able to view lab/test results, encounter notes, upcoming appointments, etc.  Non-urgent messages can be sent to your provider as well.   To learn more about what you can do with MyChart, go to [email or web address].   Other Instructions:

## 2024-11-28 NOTE — Progress Notes (Signed)
 " Cardiology Office Note:    Date:  11/28/2024  ID:  Elizabeth Romero, DOB [birth year], MRN [id number] PCP: Pcp, No  [facility] Providers Cardiologist:  Jerel Balding, MD       Patient Profile:       Chief Complaint: 1 year follow-up History of Present Illness:  Elizabeth Romero is a 59 y.o. female with visit-pertinent history of vasovagal syncope, hypercholesteremia, palpitations  Patient has history of infrequent neurally mediated syncope (tilt test in 2008 with combined vasodepressor and cardioinhibitory components, intolerant of SSRI, well controlled on low dose beta blockers.  A 30-day event monitor in 2008 was normal.  Last seen in clinic on 07/24/2023 by Dr. Balding.  Her palpitations are reasonably controlled and she had no recent syncopal events.  She underwent CT cardiac scoring on 08/27/2023 showing coronary calcium score of 0.   Discussed the use of AI scribe software for clinical note transcription with the patient, who gave verbal consent to proceed.  History of Present Illness Elizabeth Romero is a 59 year old female with a history of palpitations and syncope who presents with a recent episode of severe chest pain in her routine follow-up.  Approximately one month ago, she had a severe sharp chest pain episode that woke her from sleep, with pain across the chest that did not improve with position change. When she moved to the living room she felt near-syncope with whole-body sweating. The pain then resolved and she fell asleep. She has not had a similar episode since.  She has had no recurrent syncope or palpitations since October 2024 and palpitations are well controlled. The chest pain was not accompanied by shortness of breath, nausea, or dizziness. She notes occasional mild mid-chest discomfort with physical activity, without significant pain or dyspnea.  She had a tilt table test in 2008. She takes metoprolol  25 mg daily around lunchtime.   She remains very active  caring for three grandchildren and her mother with advanced dementia, with a demanding daily routine.   Review of systems:  Please see the history of present illness. All other systems are reviewed and otherwise negative.      Studies Reviewed:    EKG Interpretation Date/Time:  Friday November 28 2024 09:42:11 EST Ventricular Rate:  73 PR Interval:  126 QRS Duration:  76 QT Interval:  386 QTC Calculation: 425 R Axis:   62  Text Interpretation: Normal sinus rhythm Normal ECG When compared with ECG of 24-Jul-2023 09:40, No significant change was found Confirmed by Rana Dixon [phone]) on 11/28/2024 10:09:47 AM    CT cardiac scoring 08/27/2023 IMPRESSION: 1. Coronary calcium score of 0.  Risk Assessment/Calculations:              Physical Exam:   VS:  BP 100/70 (BP Location: Right Arm, Patient Position: Sitting, Cuff Size: Normal)   Pulse 73   Ht 5' 9 (1.753 m)   Wt 170 lb (77.1 kg)   BMI 25.10 kg/m    Wt Readings from Last 3 Encounters:  11/28/24 170 lb (77.1 kg)  07/24/23 156 lb 3.2 oz (70.9 kg)  03/17/22 148 lb 3.2 oz (67.2 kg)    GEN: Well nourished, well developed in no acute distress NECK: No JVD; No carotid bruits CARDIAC: RRR, no murmurs, rubs, gallops RESPIRATORY:  Clear to auscultation without rales, wheezing or rhonchi  ABDOMEN: Soft, non-tender, non-distended EXTREMITIES:  No edema; No acute deformity      Assessment and Plan:  Precordial pain  Patient notes approximately 1 month ago she had an episode of severe/intense chest pain that woke her up from sleep.  Pain was noted to be across the entire chest and was associated with near syncope and diaphoresis.  The pain eventually gradually resolved on its own.  Patient also notes intermittent mild mid-chest discomfort on physical exertion.  Denies dyspnea, nausea, dizziness - EKG today without acute ischemic changes - CT cardiac scoring 08/2023 showed a coronary calcium score of 0 - Symptoms described  could be anginal equivalent although she did have a reassuring CT scoring a little over a year ago.  We discussed further ischemic evaluation - Plan for coronary CTA.  I will have her take her daily metoprolol  the morning before prior to the exam - Consider GI evaluation if CTA is reassuring  Palpitations Quiescent and well-controlled on low-dose beta-blocker - Continue metoprolol  succinate 25 mg daily  History of vasovagal syncope Tilt test in 2008 with combined vasodepressor and cardioinhibitory component - She has had no recurrent syncope - Encourage adequate hydration and and adequate sodium intake      Dispo:  Return in about 1 year or sooner pending coronary CTA.  Signed, Lum LITTIE Louis, NP  "
